# Patient Record
Sex: Female | Born: 1965 | Race: White | Hispanic: No | Marital: Married | Smoking: Never smoker
Health system: Southern US, Community
[De-identification: ages and names within clinical notes are randomized; demographics above are authoritative.]

## PROBLEM LIST (undated history)

## (undated) DIAGNOSIS — E559 Vitamin D deficiency, unspecified: Secondary | ICD-10-CM

## (undated) DIAGNOSIS — D1803 Hemangioma of intra-abdominal structures: Secondary | ICD-10-CM

## (undated) DIAGNOSIS — I493 Ventricular premature depolarization: Secondary | ICD-10-CM

## (undated) DIAGNOSIS — E785 Hyperlipidemia, unspecified: Secondary | ICD-10-CM

## (undated) DIAGNOSIS — K439 Ventral hernia without obstruction or gangrene: Secondary | ICD-10-CM

## (undated) DIAGNOSIS — I10 Essential (primary) hypertension: Secondary | ICD-10-CM

## (undated) DIAGNOSIS — I00 Rheumatic fever without heart involvement: Secondary | ICD-10-CM

## (undated) HISTORY — DX: Hemangioma of intra-abdominal structures: D18.03

## (undated) HISTORY — DX: Hyperlipidemia, unspecified: E78.5

## (undated) HISTORY — DX: Ventricular premature depolarization: I49.3

## (undated) HISTORY — DX: Essential (primary) hypertension: I10

## (undated) HISTORY — DX: Ventral hernia without obstruction or gangrene: K43.9

## (undated) HISTORY — DX: Vitamin D deficiency, unspecified: E55.9

## (undated) HISTORY — DX: Rheumatic fever without heart involvement: I00

---

## 2000-02-17 ENCOUNTER — Other Ambulatory Visit: Admission: RE | Admit: 2000-02-17 | Discharge: 2000-02-17 | Payer: Self-pay | Admitting: Family Medicine

## 2004-07-31 HISTORY — PX: PELVIC LAPAROSCOPY: SHX162

## 2007-02-26 ENCOUNTER — Ambulatory Visit: Payer: Self-pay | Admitting: Internal Medicine

## 2007-04-09 ENCOUNTER — Ambulatory Visit: Payer: Self-pay | Admitting: Internal Medicine

## 2007-04-09 LAB — CONVERTED CEMR LAB
BUN: 11 mg/dL (ref 6–23)
Basophils Absolute: 0 10*3/uL (ref 0.0–0.1)
CO2: 27 meq/L (ref 19–32)
Creatinine, Ser: 0.7 mg/dL (ref 0.4–1.2)
Eosinophils Absolute: 0 10*3/uL (ref 0.0–0.6)
Glucose, Bld: 95 mg/dL (ref 70–99)
Hemoglobin: 14.7 g/dL (ref 12.0–15.0)
Monocytes Relative: 3.9 % (ref 3.0–11.0)
Neutro Abs: 7.5 10*3/uL (ref 1.4–7.7)
Neutrophils Relative %: 84 % — ABNORMAL HIGH (ref 43.0–77.0)
Platelets: 261 10*3/uL (ref 150–400)
RBC: 5.03 M/uL (ref 3.87–5.11)
RDW: 13.1 % (ref 11.5–14.6)
Sodium: 140 meq/L (ref 135–145)
TSH: 2.1 microintl units/mL (ref 0.35–5.50)
WBC: 8.9 10*3/uL (ref 4.5–10.5)

## 2007-04-12 ENCOUNTER — Ambulatory Visit (HOSPITAL_COMMUNITY): Admission: RE | Admit: 2007-04-12 | Discharge: 2007-04-12 | Payer: Self-pay | Admitting: Internal Medicine

## 2007-06-25 ENCOUNTER — Encounter: Payer: Self-pay | Admitting: Internal Medicine

## 2008-01-08 ENCOUNTER — Ambulatory Visit: Payer: Self-pay | Admitting: Internal Medicine

## 2008-01-08 DIAGNOSIS — R0602 Shortness of breath: Secondary | ICD-10-CM | POA: Insufficient documentation

## 2008-10-22 ENCOUNTER — Encounter: Admission: RE | Admit: 2008-10-22 | Discharge: 2008-10-22 | Payer: Self-pay | Admitting: Neurology

## 2009-04-27 ENCOUNTER — Ambulatory Visit: Payer: Self-pay | Admitting: Cardiology

## 2009-04-27 DIAGNOSIS — I4949 Other premature depolarization: Secondary | ICD-10-CM

## 2009-04-27 DIAGNOSIS — R002 Palpitations: Secondary | ICD-10-CM

## 2009-04-29 ENCOUNTER — Telehealth (INDEPENDENT_AMBULATORY_CARE_PROVIDER_SITE_OTHER): Payer: Self-pay | Admitting: *Deleted

## 2009-05-05 ENCOUNTER — Telehealth: Payer: Self-pay | Admitting: Cardiology

## 2009-05-11 ENCOUNTER — Encounter: Payer: Self-pay | Admitting: Cardiology

## 2009-05-13 ENCOUNTER — Ambulatory Visit: Payer: Self-pay | Admitting: Cardiology

## 2009-05-19 ENCOUNTER — Telehealth (INDEPENDENT_AMBULATORY_CARE_PROVIDER_SITE_OTHER): Payer: Self-pay | Admitting: *Deleted

## 2009-07-08 ENCOUNTER — Ambulatory Visit: Payer: Self-pay | Admitting: Internal Medicine

## 2009-07-09 ENCOUNTER — Telehealth: Payer: Self-pay | Admitting: Internal Medicine

## 2009-07-09 ENCOUNTER — Ambulatory Visit (HOSPITAL_COMMUNITY): Admission: RE | Admit: 2009-07-09 | Discharge: 2009-07-09 | Payer: Self-pay | Admitting: Internal Medicine

## 2009-07-20 ENCOUNTER — Ambulatory Visit (HOSPITAL_COMMUNITY): Admission: RE | Admit: 2009-07-20 | Discharge: 2009-07-20 | Payer: Self-pay | Admitting: Internal Medicine

## 2009-07-20 ENCOUNTER — Ambulatory Visit: Payer: Self-pay

## 2009-07-20 ENCOUNTER — Ambulatory Visit: Payer: Self-pay | Admitting: Cardiology

## 2009-07-20 ENCOUNTER — Encounter: Payer: Self-pay | Admitting: Internal Medicine

## 2009-08-10 ENCOUNTER — Encounter: Admission: RE | Admit: 2009-08-10 | Discharge: 2009-08-10 | Payer: Self-pay | Admitting: Internal Medicine

## 2009-10-28 ENCOUNTER — Telehealth (INDEPENDENT_AMBULATORY_CARE_PROVIDER_SITE_OTHER): Payer: Self-pay | Admitting: *Deleted

## 2010-08-15 ENCOUNTER — Encounter
Admission: RE | Admit: 2010-08-15 | Discharge: 2010-08-15 | Payer: Self-pay | Source: Home / Self Care | Attending: Obstetrics & Gynecology | Admitting: Obstetrics & Gynecology

## 2010-08-22 ENCOUNTER — Encounter
Admission: RE | Admit: 2010-08-22 | Discharge: 2010-08-22 | Payer: Self-pay | Source: Home / Self Care | Attending: Obstetrics & Gynecology | Admitting: Obstetrics & Gynecology

## 2010-08-30 NOTE — Progress Notes (Signed)
  Faxed pt [facility], Recieved back 3/30,copied records Mailed out to pt today. [facility]  October 28, 2009 9:12 AM

## 2010-12-13 NOTE — Assessment & Plan Note (Signed)
[facility]                             PULMONARY OFFICE NOTE   Sandra Mcgrath, Sandra Mcgrath                        MRN:          [id number]  DATE:04/09/2007                            DOB:          04/23/1966    HISTORY:  A 45 year old white female seen with possible asthma on February 26, 2007, and scheduled for methacholine challenge test this week.  However, in the meantime, she has been having paroxysms of dyspnea over  the last several days that almost always occur with rest with the  sensation that she is not able to take a deep breath, and today had for  the 1st time numbness in both fingertips associated with one of these  spells.  She did take her husband's albuterol, but found it not to be of  any benefit.   She denies any associated chest pain, pleuritic pain, orthopnea, PND,  leg swelling, fevers, chills, sweats, over sinus or reflux symptoms. No  previous h/o nocturnal spells or reproduction of spells with exertion.   PHYSICAL EXAMINATION:  She is a pleasant ambulatory white female who  appears very healthy.  She takes obvious sigh breaths during the  interview and exam.  HEENT:  Unremarkable.  Oropharynx clear.  LUNG FIELDS:  Perfectly clear bilaterally to auscultation and  percussion.  HEART:  Regular rhythm without murmur, gallop, or rub.  ABDOMEN:  Soft and benign.  EXTREMITIES:  Warm without calf tenderness, cyanosis, clubbing, or  edema.   PFTs were performed today, and demonstrated to be normal while she was  active dyspnea.  We ambulated her around the hall.  We did 3 laps at 185  each rapidly.  We lost her pulse at one point related to possible  acrylic nails, but she maintained saturations above 95 with a peak heart  rate of around 182.   Chest x-ray was normal.   Lab studies are pending.   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  I spent extra time reviewing this issue with Dr. Lysbeth Penner, a  dentist,  using graphic formatted material to help her understand that she needs  to relax FRC before breathing, or her tidal breaths will feel very  uncomfortable.  To help her relax, I recommended a trial of Xanax 0.5 mg  1/2 every 6 hours, and did recommend strongly that she go ahead and have  the methacholine challenge test, because even though her PFTs are  normal, she may well have a component of asthma, and that a negative  methacholine challenge would be very helpful in the future in terms of  directing her care.    Sandra Mcgrath. Sandra Sires, MD, [facility]  Electronically Signed   MBW/MedQ  DD: 04/09/2007  DT: 04/10/2007  Job #: [id number]

## 2010-12-13 NOTE — Assessment & Plan Note (Signed)
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  Intermittently, she had the sensation that she  cannot get a deep breath that is resolved by taking shallower breaths.  It has not affected exercise tolerance nor sleep.   She has already been evaluated in [place] and found to have a normal  expiratory flow volume loop but somewhat truncated inspiratory loop (see  results below).  She has not tried any medications and denies any  obvious triggering alleviating factors that she can identify.  VQ scan  was reported to be normal, which presumably would have included a chest  x-ray, as well, on the same date.   She has had mild chronic nasal congestion with stuffy nose that is worse  since exposure to the fumes and dust as noted above, but only slightly  so and does not seem to correlate well with her other symptoms.   PAST MEDICAL HISTORY:  Cesarean section in 2000.   ALLERGIES:  None known.   MEDICATIONS:  Omega fish oil daily.   SOCIAL HISTORY:  She has never smoked, and she works as a Education officer, community.   FAMILY HISTORY:  Significant for asthma in her son, but notes that her  husband's side of the family is all asthmatic.   REVIEW OF SYSTEMS:  Taken in detail on the worksheet and negative,  except for as outlined above.   PHYSICAL EXAMINATION:  GENERAL:  This is a pleasant ambulatory white  female in no acute distress.  VITAL  SIGNS:  The patient had stable vital signs.  HEENT:  Unremarkable.  Oropharynx clear.  Nasal turbinates reveal  moderate non-specific turbinate edema with no excess discharge.  Oropharynx is clear with no evidence of postnasal drainage or excessive  throat cleaning.  LUNGS:  Lung fields are perfectly clear bilaterally to auscultation and  percussion.  HEART:  Regular rate and rhythm without murmur, gallop, rub or increase  in P2.  ABDOMEN:  Soft, benign.  EXTREMITIES:  Warm without calf tenderness.  No cyanosis, clubbing or  edema.   PFT's from [facility] from [facility] in [place]  dated February 08, 2007 indicate a perfectly normal expiratory loop but  somewhat truncated inspiratory loop without flattening.   IMPRESSION:  Upper airways instability associated with sensation of  difficult inspiration suggesting a form of vocal cord dysfunction  syndrome that may be related to occult reflux or may be functional in  nature.  Certainly, she has evidence of rhinitis with recent triggering  factors being exposure to a renovated house with heavy exposure to paint  and polyurethane fumes from flooring, as well as the dust caused by the  renovation.   I explained to the patient also that just being aware that her breathing  is a little bit different than normal, it may make her trigger extra  breaths similar to the hyperventilation syndrome because when she takes  voluntary breaths, there is no way to relax to FRC between breaths.   Reassuring is the fact that she can exercise normally and also sleep  fine.   RECOMMENDATIONS:  1. I recommended empiric therapy with Aciphex dosed 20 mg before      breakfast daily for the next 30 days, and, if not improving on      this, to have a methacholine challenge test done at the end of the      test to make sure she is not asthmatic.  2. Consider allergy evaluation if she continues to have significant      rhinitis symptoms by Dr.  Lucie Leather, who may also be treating her son      by that time.  3. I also gave her general guidance regarding minimizing exposure to      dust and fumes, including changing all the filters in her new      house.  4. Pulmonary follow up, however, can be p.r.n.     Charlaine Dalton. Sherene Sires, MD, [facility]  Electronically Signed    MBW/MedQ  DD: 02/26/2007  DT: 02/27/2007  Job #: [id number]

## 2010-12-19 ENCOUNTER — Encounter: Payer: BC Managed Care – PPO | Admitting: Genetic Counselor

## 2011-04-21 ENCOUNTER — Encounter: Payer: Self-pay | Admitting: Cardiology

## 2011-04-24 ENCOUNTER — Encounter: Payer: Self-pay | Admitting: Cardiology

## 2011-04-24 ENCOUNTER — Ambulatory Visit (INDEPENDENT_AMBULATORY_CARE_PROVIDER_SITE_OTHER): Payer: BC Managed Care – PPO | Admitting: Cardiology

## 2011-04-24 VITALS — BP 160/91 | HR 118 | Ht 66.0 in | Wt 129.0 lb

## 2011-04-24 DIAGNOSIS — R002 Palpitations: Secondary | ICD-10-CM

## 2011-04-24 DIAGNOSIS — I10 Essential (primary) hypertension: Secondary | ICD-10-CM

## 2011-04-24 MED ORDER — DILTIAZEM HCL ER COATED BEADS 120 MG PO CP24: 120.0000 mg | ORAL_CAPSULE | Freq: Every day | ORAL | Status: DC

## 2011-04-24 NOTE — Progress Notes (Signed)
PCP: Dr. Timothy Lasso  45 yo with history of palpitations and PACs/inappropriate sinus tachycardia as well as elevated blood pressure/probable HTN presents for cardiology evaluation.   Patient has a long history of palpitations.  Workup was initially done back in 2005 in [place].  She wore an event monitor and was found to have PACs as well as runs of inappropriate sinus tachycardia.  Subsequently, she had a negative coronary CT angiogram in 2007.  She saw Dr. Sherene Sires for a workup of dyspnea in 2010 that seems to have been unrevealing.  Echo was essentially normal in 5/11.    In August, patient had about 4 episodes where she would wake up at night feeling hot and feeling her heart race.  Heart rate would range from the 130s-150s during these episodes.  They would subside on their own.  For the last 10-14 days, she has had no more of these episodes of heart racing.  She had been on Toprol XL 12.5 mg daily.  Earlier this year, lisinopril 5 mg daily was added to her regimen for high blood pressure.  She became lightheaded on the combination and subsequently stopped both.  With the episodes in August, she restarted Toprol XL but thought is was dropping her HR too low so she stopped it again (HR around 50). She brings blood pressure readings from August until today.  Until about a week ago, SBP was in the 110s-120s.  Starting last week, BP has been running higher for no apparent reason.  It is now ranging in the 130s-160s/90s-100s when she checks at home (consistent with our reading in the office today).  She denies recent lightheadedness.   She has never had syncope.  She avoids all caffeine.    Patient has excellent exercise tolerance.  She frequently plays tennis.  No exertional dyspnea, no exertional chest pain.    ECG: NSR, very small R waves in V2 and V3, otherwise normal.   Labs (9/12): Lp(a) 30 (normal), K 4.1, creatinine 0.7, LDL 104, HDL 51  PMH: 1. Paroxysmal dyspnea: Workup by Dr. Sherene Sires in 2010.  Normal  PFTs, negative V/Q scan.  ? Related to Toprol XL use. ? Anxiety.  Echo (5/11) with EF 60-65%, trace MR, no significant abnormality.  2. Palpitations: PACs, inappropriate sinus tachycardia.  Workup in [place] in 2005.  Event monitor showed runs of inappropriate sinus tachycardia.  Holter in 2008 or 2009 showed PACs.   3. Coronary CT angiogram in 2007 was normal.   4. Migraines 5. Endometriosis 6. HTN  7. Anxiety  FH: HTN, no sudden cardiac death, no premature CAD.  Sister with breast cancer.   SH: Married, lives with husband in [place].  1 child.  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    ROS: All systems reviewed and negative except as per HPI.   Current Outpatient Prescriptions  Medication Sig Dispense Refill  . diazepam (VALIUM) 5 MG tablet Take 1/2 tablet for anxiety       . levonorgestrel (MIRENA) 20 MCG/24HR IUD 1 each by Intrauterine route once.        . diltiazem (CARDIZEM CD) 120 MG 24 hr capsule Take 1 capsule (120 mg total) by mouth daily.  30 capsule  11    BP 160/91  Pulse 118  Ht 5\' 6"  (1.676 m)  Wt 129 lb (58.514 kg)  BMI 20.82 kg/m2 General: NAD Neck: No JVD, no thyromegaly or thyroid nodule.  Lungs: Clear to auscultation bilaterally with normal respiratory effort.  CV: Nondisplaced PMI.  Heart regular S1/S2, no S3/S4, no murmur.  No peripheral edema.  No carotid bruit.  Normal pedal pulses.  Abdomen: Soft, nontender, no hepatosplenomegaly, no distention.  Skin: Intact without lesions or rashes.  Neurologic: Alert and oriented x 3.  Psych: Normal affect. Extremities: No clubbing or cyanosis.  HEENT: Normal.

## 2011-04-24 NOTE — Patient Instructions (Signed)
Your physician recommends that you schedule a follow-up appointment in: 3 WEEKS WITH DR [facility]  Your physician has recommended you make the following change in your medication: START DILTIAZEM CD 120 MG  Your physician recommends that you return for lab work in: 24 HOUR URINE  DX 785.1

## 2011-04-25 ENCOUNTER — Other Ambulatory Visit: Payer: BC Managed Care – PPO | Admitting: *Deleted

## 2011-04-25 DIAGNOSIS — I10 Essential (primary) hypertension: Secondary | ICD-10-CM | POA: Insufficient documentation

## 2011-04-25 DIAGNOSIS — R002 Palpitations: Secondary | ICD-10-CM

## 2011-04-25 NOTE — Assessment & Plan Note (Signed)
BP is running high today and was running high over the last week when she checked at home.  Prior to that, it had been under good control.  She does seem to have significant variation in her BP and arrhythmia symptoms over time.  Although the risk is probably not extremely high, I think that we should collect urinary catecholeamines to screen for pheochromocytoma, as this could give her episodic symptoms similar to what she is experiencing.  As above, I started diltiazem CD to see if this would lower BP and control palpitations.    I will have her followup in 2-3 week to see how diltiazem is working.

## 2011-04-25 NOTE — Assessment & Plan Note (Signed)
Patient has had documented PACs as well as inappropriate sinus tachycardia.  She was having runs of tachycardia that would awaken her at night back in 8/12 but has had none recently.  This certainly could have been IST.  For now, given extensive evaluation in the past, I am going to hold off on having her do another 3-week event monitor.  I will have her start diltiazem CD 120 mg daily.  This may help suppress PACs and IST and should also lower BP.  It may be better tolerated by her than Toprol XL.  If symptoms begin to recur on diltiazem, I will set her up for an event monitor.

## 2011-04-27 ENCOUNTER — Telehealth: Payer: Self-pay | Admitting: *Deleted

## 2011-04-27 NOTE — Telephone Encounter (Signed)
Message left on triage line from pt requesting results of 24 hour urine. Call back number is [phone].

## 2011-04-27 NOTE — Telephone Encounter (Signed)
Spoke with pt and let her know results are still pending. She states she had episode of tachycardia Monday and Wednesday night.  She is aware Dr. Shirlee Latch is not in office but I told her I would forward this message to him.

## 2011-04-28 LAB — METANEPHRINES, URINE, 24 HOUR
Metaneph Total, Ur: 280 mcg/24 h (ref 182–739)
Metanephrines, Ur: 95 mcg/24 h (ref 58–203)

## 2011-04-29 LAB — CATECHOLAMINES, FRACTIONATED, URINE, 24 HOUR
Calculated Total (E+NE): 39 mcg/24 h (ref 26–121)
Total Volume - CF 24Hr U: 1800 mL

## 2011-05-04 ENCOUNTER — Encounter: Payer: Self-pay | Admitting: Cardiology

## 2011-05-17 ENCOUNTER — Ambulatory Visit: Payer: BC Managed Care – PPO | Admitting: Cardiology

## 2011-06-05 ENCOUNTER — Ambulatory Visit: Payer: BC Managed Care – PPO | Admitting: Cardiology

## 2011-06-05 ENCOUNTER — Encounter: Payer: Self-pay | Admitting: Cardiology

## 2011-06-05 ENCOUNTER — Ambulatory Visit (INDEPENDENT_AMBULATORY_CARE_PROVIDER_SITE_OTHER): Payer: BC Managed Care – PPO | Admitting: Cardiology

## 2011-06-05 DIAGNOSIS — I10 Essential (primary) hypertension: Secondary | ICD-10-CM

## 2011-06-05 DIAGNOSIS — R002 Palpitations: Secondary | ICD-10-CM

## 2011-06-06 NOTE — Assessment & Plan Note (Signed)
BP under good control with Toprol XL 12.5 mg daily.

## 2011-06-06 NOTE — Progress Notes (Signed)
PCP: Dr. Timothy Lasso  45 yo with history of palpitations and PACs/inappropriate sinus tachycardia as well as elevated blood pressure/probable HTN returns for cardiology evaluation.   Patient has a long history of palpitations.  Workup was initially done back in 2005 in [place].  She wore an event monitor and was found to have PACs as well as runs of inappropriate sinus tachycardia.  Subsequently, she had a negative coronary CT angiogram in 2007.  She saw Dr. Sherene Sires for a workup of dyspnea in 2010 that seems to have been unrevealing.  Echo was essentially normal in 5/11.    In August, patient had about 4 episodes where she would wake up at night feeling hot and feeling her heart race.  Heart rate would range from the 130s-150s during these episodes.  They would subside on their own.  For the last 10-14 days, she has had no more of these episodes of heart racing.  She had been on Toprol XL 12.5 mg daily.  Earlier this year, lisinopril 5 mg daily was added to her regimen for high blood pressure.  She became lightheaded on the combination and subsequently stopped both.  With the episodes in August, she restarted Toprol XL but thought is was dropping her HR too low so she stopped it again (HR around 50).  Also, prior to initial appointment, BP had been running high.  She denies lightheadedness.   She has never had syncope.  She avoids all caffeine.  Patient has excellent exercise tolerance.  She frequently plays tennis.  No exertional dyspnea, no exertional chest pain.    At last appointment, I had her start diltiazem CD 120 mg daily.  This caused her ankles to swell so she stopped it.  She started back on Toprol XL 12.5 mg daily.  Her systolic BP has been running in the 110s to 120s on this and her palpitations have essentially resolved.  Urinary catecholeamines were normal.   Labs (9/12): Lp(a) 30 (normal), K 4.1, creatinine 0.7, LDL 104, HDL 51 Labs (10/12): Urinary catecholeamines normal  PMH: 1. Paroxysmal  dyspnea: Workup by Dr. Sherene Sires in 2010.  Normal PFTs, negative V/Q scan.  ? Related to Toprol XL use. ? Anxiety.  Echo (5/11) with EF 60-65%, trace MR, no significant abnormality.  2. Palpitations: PACs, inappropriate sinus tachycardia.  Workup in [place] in 2005.  Event monitor showed runs of inappropriate sinus tachycardia.  Holter in 2008 or 2009 showed PACs.  Urinary catecholeamines normal.  3. Coronary CT angiogram in 2007 was normal.   4. Migraines 5. Endometriosis 6. HTN: edema with diltiazem.  7. Anxiety  FH: HTN, no sudden cardiac death, no premature CAD.  Sister with breast cancer.   SH: Married, lives with husband in [place].  1 child.  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     Current Outpatient Prescriptions  Medication Sig Dispense Refill  . levonorgestrel (MIRENA) 20 MCG/24HR IUD 1 each by Intrauterine route once.        . metoprolol succinate (TOPROL-XL) 12.5 mg TB24 Take 12.5 mg by mouth daily.          BP 140/90  Pulse 84  Ht 5\' 6"  (1.676 m)  Wt 59.421 kg (131 lb)  BMI 21.14 kg/m2 General: NAD Neck: No JVD, no thyromegaly or thyroid nodule.  Lungs: Clear to auscultation bilaterally with normal respiratory effort. CV: Nondisplaced PMI.  Heart regular S1/S2, no S3/S4, no murmur.  No peripheral edema.  No carotid bruit.  Normal  pedal pulses.  Abdomen: Soft, nontender, no hepatosplenomegaly, no distention.  Skin: Intact without lesions or rashes.  Neurologic: Alert and oriented x 3.  Psych: Normal affect. Extremities: No clubbing or cyanosis.  HEENT: Normal.

## 2011-06-06 NOTE — Assessment & Plan Note (Signed)
Patient has had documented PACs as well as inappropriate sinus tachycardia.  She had been having runs of tachycardia that would awaken her at night.  This certainly could have been IST.  Given extensive evaluation in the past, I am going to hold off on having her do another 3-week event monitor. Blood pressure and symptoms both seem to be doing well on Toprol XL 12.5 mg daily so I will continue this.  She should continue to avoid caffeine.

## 2011-07-20 ENCOUNTER — Other Ambulatory Visit: Payer: Self-pay | Admitting: Obstetrics & Gynecology

## 2011-07-20 DIAGNOSIS — Z1231 Encounter for screening mammogram for malignant neoplasm of breast: Secondary | ICD-10-CM

## 2011-08-25 ENCOUNTER — Ambulatory Visit
Admission: RE | Admit: 2011-08-25 | Discharge: 2011-08-25 | Disposition: A | Discharge status: Home / Self Care | Payer: BC Managed Care – PPO | Source: Ambulatory Visit | Attending: Obstetrics & Gynecology | Admitting: Obstetrics & Gynecology

## 2011-08-25 DIAGNOSIS — Z1231 Encounter for screening mammogram for malignant neoplasm of breast: Secondary | ICD-10-CM

## 2011-10-12 ENCOUNTER — Encounter: Payer: Self-pay | Admitting: *Deleted

## 2011-10-12 ENCOUNTER — Telehealth: Payer: Self-pay | Admitting: Cardiology

## 2011-10-12 NOTE — Telephone Encounter (Signed)
Pt aware letter has been done and mailed to her at her request.

## 2011-10-12 NOTE — Telephone Encounter (Signed)
Pt needs letter stating that her EKG was normal so she can send it to her insurance co , it's up for renewal , pls mail to pt

## 2011-12-14 LAB — HM PAP SMEAR

## 2012-07-31 HISTORY — PX: AUGMENTATION MAMMAPLASTY: SUR837

## 2012-08-01 ENCOUNTER — Other Ambulatory Visit: Payer: Self-pay | Admitting: Internal Medicine

## 2012-08-01 DIAGNOSIS — Z1231 Encounter for screening mammogram for malignant neoplasm of breast: Secondary | ICD-10-CM

## 2012-08-27 ENCOUNTER — Ambulatory Visit
Admission: RE | Admit: 2012-08-27 | Discharge: 2012-08-27 | Disposition: A | Discharge status: Home / Self Care | Payer: BC Managed Care – PPO | Source: Ambulatory Visit | Attending: Internal Medicine | Admitting: Internal Medicine

## 2012-08-27 DIAGNOSIS — Z1231 Encounter for screening mammogram for malignant neoplasm of breast: Secondary | ICD-10-CM

## 2012-11-20 ENCOUNTER — Encounter: Payer: Self-pay | Admitting: Certified Nurse Midwife

## 2012-11-20 ENCOUNTER — Ambulatory Visit (INDEPENDENT_AMBULATORY_CARE_PROVIDER_SITE_OTHER): Payer: BC Managed Care – PPO | Admitting: Certified Nurse Midwife

## 2012-11-20 VITALS — BP 110/62 | HR 70 | Resp 16

## 2012-11-20 DIAGNOSIS — B372 Candidiasis of skin and nail: Secondary | ICD-10-CM

## 2012-11-20 DIAGNOSIS — B373 Candidiasis of vulva and vagina: Secondary | ICD-10-CM

## 2012-11-20 MED ORDER — NYSTATIN-TRIAMCINOLONE 100000-0.1 UNIT/GM-% EX OINT: TOPICAL_OINTMENT | Freq: Two times a day (BID) | CUTANEOUS | Status: DC

## 2012-11-20 MED ORDER — FLUCONAZOLE 150 MG PO TABS: 150.0000 mg | ORAL_TABLET | Freq: Once | ORAL | Status: DC

## 2012-11-20 NOTE — Progress Notes (Signed)
47 y.o.Married Caucasian female G3P1 with complaint of vaginal itching/irritation, with slight discharge, no odor in past 24 hours.  Sexually active: yes . Pt also reports the following associated symptoms: none Patient has tried over the counter treatment of Monistat, baking soda bath with minimal relief. No partner change or STD concerns. No new personal products     Exam:  ZOX:WRUEAV edema noted, redness                WUJ:[id number]: copious, white and thick, pH 4.0, wet prep done                Cx:  normal appearance, non tender                Uterus:normal size, non-tender                Adnexa: normal adnexa and no mass, fullness, tenderness  Wet Prep shows:Yeast   NF:AOZHY Vaginitis Yeast Dermatitis   P: Reviewed findings Rx Diflucan see order Rx Mycolog see order Aveeno sitz bath prn comfort   Reviewed, TL

## 2013-01-02 ENCOUNTER — Encounter: Payer: Self-pay | Admitting: Obstetrics & Gynecology

## 2013-01-02 ENCOUNTER — Ambulatory Visit (INDEPENDENT_AMBULATORY_CARE_PROVIDER_SITE_OTHER): Payer: BC Managed Care – PPO | Admitting: Obstetrics & Gynecology

## 2013-01-02 VITALS — BP 120/78 | HR 60 | Resp 16 | Ht 65.5 in | Wt 120.8 lb

## 2013-01-02 DIAGNOSIS — Z01419 Encounter for gynecological examination (general) (routine) without abnormal findings: Secondary | ICD-10-CM

## 2013-01-02 DIAGNOSIS — Z0000 Encounter for general adult medical examination without abnormal findings: Secondary | ICD-10-CM

## 2013-01-02 LAB — POCT URINALYSIS DIPSTICK
Bilirubin, UA: NEGATIVE
Ketones, UA: NEGATIVE
Leukocytes, UA: NEGATIVE
Protein, UA: NEGATIVE
Urobilinogen, UA: NEGATIVE

## 2013-01-02 MED ORDER — SULFAMETHOXAZOLE-TMP DS 800-160 MG PO TABS: 1.0000 | ORAL_TABLET | Freq: Two times a day (BID) | ORAL | Status: DC

## 2013-01-02 MED ORDER — FLUCONAZOLE 150 MG PO TABS: 150.0000 mg | ORAL_TABLET | Freq: Once | ORAL | Status: DC

## 2013-01-02 NOTE — Patient Instructions (Addendum)

## 2013-01-02 NOTE — Progress Notes (Signed)
47 y.o. G3P1 Sep.CaucasianF here for annual exam.  Dating.  Has not filed any paperwork for divorce yet.  Two yeast infections since then.  Spotted once since Mirena IUD placement 7/13.     Patient's last menstrual period was 12/23/2012.          Sexually active: yes  The current method of family planning is IUD.    Exercising: yes  tennis and walking Smoker:  no  Health Maintenance: Pap:  12/14/11 WNL/negative HR HPV History of abnormal Pap:  no MMG:  08/27/12 normal Colonoscopy:  none BMD:   none TDaP:  Up to date ? 2008 Screening Labs: PCP, Hb today: PCP, Urine today: neg   reports that she has never smoked. She has never used smokeless tobacco. She reports that  drinks alcohol. She reports that she does not use illicit drugs.  Past Medical History  Diagnosis Date  . Racing heart beat   . Hypertension   . Hyperlipidemia   . Juvenile rheumatic fever     no MVP, negative ECHO 12/10    Past Surgical History  Procedure Laterality Date  . Cesarean section  2/07    35 weeks  . Pelvic laparoscopy  2006    w/HSG, endometrioma  . Augmentation mammaplasty  07/2012    Current Outpatient Prescriptions  Medication Sig Dispense Refill  . cyanocobalamin (,VITAMIN B-12,) 1000 MCG/ML injection Inject 1,000 mcg into the muscle every 30 (thirty) days.      Marland Kitchen levonorgestrel (MIRENA) 20 MCG/24HR IUD 1 each by Intrauterine route once.        . metoprolol succinate (TOPROL-XL) 12.5 mg TB24 Take 12.5 mg by mouth daily.        No current facility-administered medications for this visit.    Family History  Problem Relation Age of Onset  . Hypertension Father 85    alive  . Hypertension Mother 25    alive  . Breast cancer Sister 47    genetic testing neg    ROS:  Pertinent items are noted in HPI.  Otherwise, a comprehensive ROS was negative.  Exam:   BP 120/78  Pulse 60  Resp 16  Ht 5' 5.5" (1.664 m)  Wt 120 lb 12.8 oz (54.795 kg)  BMI 19.79 kg/m2  LMP 12/23/2012  Weight change:  +1lb   Height: 5' 5.5" (166.4 cm)  Ht Readings from Last 3 Encounters:  01/02/13 5' 5.5" (1.664 m)  06/05/11 5\' 6"  (1.676 m)  04/24/11 5\' 6"  (1.676 m)    General appearance: alert, cooperative and appears stated age Head: Normocephalic, without obvious abnormality, atraumatic Neck: no adenopathy, supple, symmetrical, trachea midline and thyroid normal to inspection and palpation Lungs: clear to auscultation bilaterally Breasts: normal appearance, no masses or tenderness Heart: regular rate and rhythm Abdomen: soft, non-tender; bowel sounds normal; no masses,  no organomegaly Extremities: extremities normal, atraumatic, no cyanosis or edema Skin: Skin color, texture, turgor normal. No rashes or lesions Lymph nodes: Cervical, supraclavicular, and axillary nodes normal. No abnormal inguinal nodes palpated Neurologic: Grossly normal   Pelvic: External genitalia:  no lesions              Urethra:  normal appearing urethra with no masses, tenderness or lesions              Bartholins and Skenes: normal                 Vagina: normal appearing vagina with normal color and discharge, no lesions  Cervix: no lesions              Pap taken: yes Bimanual Exam:  Uterus:  normal size, contour, position, consistency, mobility, non-tender              Adnexa: normal adnexa and no mass, fullness, tenderness               Rectovaginal: Confirms               Anus:  normal sphincter tone, no lesions  A:  Well Woman with normal exam  P:   mammogram pap smear return annually or prn  An After Visit Summary was printed and given to the patient.

## 2013-01-02 NOTE — Progress Notes (Signed)
47 y.o. G3P1 Sep.CaucasianF here for annual exam.  Dating.  Two yeast infections since then.  Spotted once since Mirena IUD placement 7/13.     Patient's last menstrual period was 12/23/2012.          Sexually active: yes  The current method of family planning is IUD.    Exercising: yes  tennis and walking Smoker:  no  Health Maintenance: Pap:  12/14/11 WNL/negative HR HPV History of abnormal Pap:  no MMG:  08/27/12 normal Colonoscopy:  none BMD:   none TDaP:  Up to date ? 2008 Screening Labs: PCP, Hb today: PCP, Urine today: neg   reports that she has never smoked. She has never used smokeless tobacco. She reports that  drinks alcohol. She reports that she does not use illicit drugs.  Past Medical History  Diagnosis Date  . Racing heart beat   . Hypertension   . Hyperlipidemia   . Juvenile rheumatic fever     no MVP, negative ECHO 12/10    Past Surgical History  Procedure Laterality Date  . Cesarean section  2/07    35 weeks  . Pelvic laparoscopy  2006    w/HSG, endometrioma  . Augmentation mammaplasty  07/2012    Current Outpatient Prescriptions  Medication Sig Dispense Refill  . cyanocobalamin (,VITAMIN B-12,) 1000 MCG/ML injection Inject 1,000 mcg into the muscle every 30 (thirty) days.      Marland Kitchen levonorgestrel (MIRENA) 20 MCG/24HR IUD 1 each by Intrauterine route once.        . metoprolol succinate (TOPROL-XL) 12.5 mg TB24 Take 12.5 mg by mouth daily.        No current facility-administered medications for this visit.    Family History  Problem Relation Age of Onset  . Hypertension Father 62    alive  . Hypertension Mother 25    alive  . Breast cancer Sister 87    genetic testing neg    ROS:  Pertinent items are noted in HPI.  Otherwise, a comprehensive ROS was negative.  Exam:   BP 120/78  Pulse 60  Resp 16  Ht 5' 5.5" (1.664 m)  Wt 120 lb 12.8 oz (54.795 kg)  BMI 19.79 kg/m2  LMP 12/23/2012  Weight change: @WEIGHTCHANGE @ Height:   Height: 5' 5.5"  (166.4 cm)  Ht Readings from Last 3 Encounters:  01/02/13 5' 5.5" (1.664 m)  06/05/11 5\' 6"  (1.676 m)  04/24/11 5\' 6"  (1.676 m)    General appearance: alert, cooperative and appears stated age Head: Normocephalic, without obvious abnormality, atraumatic Neck: no adenopathy, supple, symmetrical, trachea midline and thyroid normal to inspection and palpation Lungs: clear to auscultation bilaterally Breasts: normal appearance, no masses or tenderness, with bilateral implants Heart: regular rate and rhythm Abdomen: soft, non-tender; bowel sounds normal; no masses,  no organomegaly Extremities: extremities normal, atraumatic, no cyanosis or edema Skin: Skin color, texture, turgor normal. No rashes or lesions Lymph nodes: Cervical, supraclavicular, and axillary nodes normal. No abnormal inguinal nodes palpated Neurologic: Grossly normal   Pelvic: External genitalia:  no lesions              Urethra:  normal appearing urethra with no masses, tenderness or lesions              Bartholins and Skenes: normal                 Vagina: normal appearing vagina with normal color and discharge, no lesions  Cervix: no lesions and IUD string noted              Pap taken: yes Bimanual Exam:  Uterus:  normal size, contour, position, consistency, mobility, non-tender              Adnexa: normal adnexa and no mass, fullness, tenderness               Rectovaginal: Confirms               Anus:  normal sphincter tone, no lesions  A:  Well Woman with normal exam Mirena for birth control, placed 7/13 Left endometrioma in pt with know hx Two recent yeats vaginitis occurrences  Family hx of breast cancer--sister H/O post-coital UTIs  P:   Mammogram yearly.  She is doing 3D pap smear today--reflex only if ASCUS Diflucan Rx to pharmacy Bactrim DS rx to pharmacy to use PRN Will repeat U/S one year return annually or prn  An After Visit Summary was printed and given to the patient.

## 2013-01-03 NOTE — Addendum Note (Signed)
Addended by: Clide Dales R on: 01/03/2013 04:58 PM   Modules accepted: Orders

## 2013-02-18 ENCOUNTER — Other Ambulatory Visit: Payer: Self-pay | Admitting: Obstetrics & Gynecology

## 2013-02-18 DIAGNOSIS — Z202 Contact with and (suspected) exposure to infections with a predominantly sexual mode of transmission: Secondary | ICD-10-CM

## 2013-02-19 ENCOUNTER — Other Ambulatory Visit: Payer: BC Managed Care – PPO

## 2013-02-19 ENCOUNTER — Telehealth: Payer: Self-pay | Admitting: Obstetrics & Gynecology

## 2013-02-19 NOTE — Telephone Encounter (Signed)
Thank you :)

## 2013-02-19 NOTE — Telephone Encounter (Signed)
Patient dnka lab appointment today @ 1:40, I left a message for patient to call and reschedule.

## 2013-03-05 ENCOUNTER — Other Ambulatory Visit (INDEPENDENT_AMBULATORY_CARE_PROVIDER_SITE_OTHER): Payer: BC Managed Care – PPO

## 2013-03-05 DIAGNOSIS — Z202 Contact with and (suspected) exposure to infections with a predominantly sexual mode of transmission: Secondary | ICD-10-CM

## 2013-06-05 ENCOUNTER — Other Ambulatory Visit: Payer: Self-pay

## 2013-06-23 ENCOUNTER — Telehealth: Payer: Self-pay | Admitting: Obstetrics & Gynecology

## 2013-06-23 ENCOUNTER — Ambulatory Visit (INDEPENDENT_AMBULATORY_CARE_PROVIDER_SITE_OTHER): Payer: BC Managed Care – PPO | Admitting: *Deleted

## 2013-06-23 DIAGNOSIS — N390 Urinary tract infection, site not specified: Secondary | ICD-10-CM

## 2013-06-23 DIAGNOSIS — N360 Urethral fistula: Secondary | ICD-10-CM

## 2013-06-23 MED ORDER — CIPROFLOXACIN HCL 500 MG PO TABS: 500.0000 mg | ORAL_TABLET | Freq: Two times a day (BID) | ORAL | Status: DC

## 2013-06-23 NOTE — Progress Notes (Signed)
Patient came in left urine for a urine culture was in a hurry had patients she needed to see wasn't able to get any vitals. Urine Culture sent to lab.

## 2013-06-23 NOTE — Telephone Encounter (Signed)
Pt called.  She had UTI symptoms over the weekend.  Dysuria and increased frequency.  Pt starting having blood in urine today.  She had some septra at home and started it two days ago.  No fevers just uncomfortable.  She wants to know if she can come in today or can have something called in.  She will come between 1-2 to leave a urine culture.  Knows to call if symptoms worsen or if she has fevers or back pain.

## 2013-07-21 ENCOUNTER — Other Ambulatory Visit: Payer: Self-pay

## 2013-07-21 DIAGNOSIS — Z1231 Encounter for screening mammogram for malignant neoplasm of breast: Secondary | ICD-10-CM

## 2013-08-29 ENCOUNTER — Ambulatory Visit
Admission: RE | Admit: 2013-08-29 | Discharge: 2013-08-29 | Disposition: A | Discharge status: Home / Self Care | Payer: Self-pay | Source: Ambulatory Visit

## 2013-08-29 ENCOUNTER — Other Ambulatory Visit: Payer: Self-pay

## 2013-08-29 DIAGNOSIS — Z1231 Encounter for screening mammogram for malignant neoplasm of breast: Secondary | ICD-10-CM

## 2013-10-08 ENCOUNTER — Telehealth: Payer: Self-pay | Admitting: Obstetrics & Gynecology

## 2013-10-08 MED ORDER — DIAZEPAM 10 MG PO TABS: 5.0000 mg | ORAL_TABLET | Freq: Four times a day (QID) | ORAL | Status: DC | PRN

## 2013-10-08 NOTE — Telephone Encounter (Signed)
Patient is requesting a refill of valum   Pharmacy [facility] [phone]

## 2013-10-08 NOTE — Telephone Encounter (Signed)
Rx done. Please fax

## 2013-10-08 NOTE — Telephone Encounter (Signed)
Last AEX 01/02/2013 Next appt 02/19/2014  Couldn't find last refill. Paper Chart in your door.

## 2013-10-08 NOTE — Telephone Encounter (Addendum)
-  Rx faxed. -LM for pt re: rx sent to pharmacy.

## 2013-11-25 ENCOUNTER — Other Ambulatory Visit: Payer: Self-pay | Admitting: Obstetrics & Gynecology

## 2013-11-25 ENCOUNTER — Ambulatory Visit (INDEPENDENT_AMBULATORY_CARE_PROVIDER_SITE_OTHER): Payer: BC Managed Care – PPO | Admitting: Obstetrics & Gynecology

## 2013-11-25 VITALS — BP 130/76 | HR 60 | Temp 98.2°F | Ht 65.5 in | Wt 126.0 lb

## 2013-11-25 DIAGNOSIS — N390 Urinary tract infection, site not specified: Secondary | ICD-10-CM

## 2013-11-25 DIAGNOSIS — R300 Dysuria: Secondary | ICD-10-CM

## 2013-11-25 LAB — POCT URINALYSIS DIPSTICK
Bilirubin, UA: NEGATIVE
GLUCOSE UA: NEGATIVE
KETONES UA: NEGATIVE
NITRITE UA: NEGATIVE
PH UA: 5
Urobilinogen, UA: NEGATIVE

## 2013-11-25 MED ORDER — NITROFURANTOIN MONOHYD MACRO 100 MG PO CAPS: 100.0000 mg | ORAL_CAPSULE | Freq: Two times a day (BID) | ORAL | Status: DC

## 2013-11-25 MED ORDER — CIPROFLOXACIN HCL 500 MG PO TABS: 500.0000 mg | ORAL_TABLET | Freq: Two times a day (BID) | ORAL | Status: DC

## 2013-11-25 NOTE — Progress Notes (Signed)
Patient ID: Sandra Mcgrath, female   DOB: [birth year], 48 y.o.   MRN: [id number] Pt presented for urine dip and culture for UTI.  Pt states she started Septra on Sunday, but has not had any change in symptoms.  Pt states she is having an achey feeling and pain with urination.  Cipro and Macrobid sent to [facility] by Dr. Sabra Heck.  Pt is notified of this by Maryann Conners, [place].   Urine dip shows 2+ leuks, trace protein and large RBC.  Urine sent for culture.

## 2013-11-26 LAB — URINE CULTURE
COLONY COUNT: NO GROWTH
ORGANISM ID, BACTERIA: NO GROWTH

## 2013-12-10 ENCOUNTER — Encounter: Payer: Self-pay | Admitting: Obstetrics & Gynecology

## 2014-02-19 ENCOUNTER — Ambulatory Visit: Payer: BC Managed Care – PPO | Admitting: Obstetrics & Gynecology

## 2014-03-06 ENCOUNTER — Encounter: Payer: Self-pay | Admitting: Obstetrics & Gynecology

## 2014-03-06 ENCOUNTER — Ambulatory Visit (INDEPENDENT_AMBULATORY_CARE_PROVIDER_SITE_OTHER): Payer: BC Managed Care – PPO | Admitting: Obstetrics & Gynecology

## 2014-03-06 VITALS — BP 122/78 | HR 64 | Resp 16 | Ht 65.75 in | Wt 122.8 lb

## 2014-03-06 DIAGNOSIS — Z01419 Encounter for gynecological examination (general) (routine) without abnormal findings: Secondary | ICD-10-CM

## 2014-03-06 DIAGNOSIS — Z0000 Encounter for general adult medical examination without abnormal findings: Secondary | ICD-10-CM

## 2014-03-06 LAB — POCT URINALYSIS DIPSTICK
Bilirubin, UA: NEGATIVE
Blood, UA: NEGATIVE
Glucose, UA: NEGATIVE
Ketones, UA: NEGATIVE
Leukocytes, UA: NEGATIVE
Nitrite, UA: NEGATIVE
Protein, UA: NEGATIVE
Urobilinogen, UA: NEGATIVE
pH, UA: 5

## 2014-03-06 MED ORDER — NITROFURANTOIN MONOHYD MACRO 100 MG PO CAPS: 100.0000 mg | ORAL_CAPSULE | Freq: Two times a day (BID) | ORAL | Status: DC

## 2014-03-06 MED ORDER — FLUCONAZOLE 150 MG PO TABS: 150.0000 mg | ORAL_TABLET | Freq: Once | ORAL | Status: DC

## 2014-03-06 NOTE — Patient Instructions (Signed)

## 2014-03-06 NOTE — Progress Notes (Signed)
48 y.o. G51P1 Separated Caucasian Female here for AEX.  Dating 48 year old gentleman who lives in [place].  He owns several country clubs.  No vaginal bleeding.  Last August, did labs with disability paperwork.  Reports normal.  Spouse getting remarried.  This is causing stress at work as well due to partnership with him.  Son in therapy.  Has had no recurrent UTIs since starting abx with intercourse.  Will need RFs.   No LMP recorded. Patient is not currently having periods (Reason: IUD).          Sexually active: Yes.    The current method of family planning is IUD.    Exercising: Yes.    walking and tennis Smoker:  no  Health Maintenance: Pap:  01/02/13 WNL History of abnormal Pap:  no MMG:  08/29/13 3D-normal Colonoscopy:  none BMD:   none TDaP:  2009 Screening Labs: Aug with disability , Hb today: n/a, Urine today: negative   reports that she has never smoked. She has never used smokeless tobacco. She reports that she drinks about 1.5 - 2 ounces of alcohol per week. She reports that she does not use illicit drugs.  Past Medical History  Diagnosis Date  . Racing heart beat   . Hypertension   . Hyperlipidemia   . Juvenile rheumatic fever     no MVP, negative ECHO 12/10    Past Surgical History  Procedure Laterality Date  . Cesarean section  2/07    35 weeks  . Pelvic laparoscopy  2006    w/HSG, endometrioma  . Augmentation mammaplasty  07/2012    Current Outpatient Prescriptions  Medication Sig Dispense Refill  . diazepam (VALIUM) 10 MG tablet Take 0.5 tablets (5 mg total) by mouth every 6 (six) hours as needed for anxiety. One as needed, not to exceed every 8 hours, for anxiety.  30 tablet  0  . levonorgestrel (MIRENA) 20 MCG/24HR IUD 1 each by Intrauterine route once.        . metoprolol succinate (TOPROL-XL) 12.5 mg TB24 Take 12.5 mg by mouth daily.       . nitrofurantoin, macrocrystal-monohydrate, (MACROBID) 100 MG capsule Take 1 capsule (100 mg total) by mouth 2 (two)  times daily.  30 capsule  6  . NON FORMULARY Vitamin B with Biotin daily      . cyanocobalamin (,VITAMIN B-12,) 1000 MCG/ML injection Inject 1,000 mcg into the muscle every 30 (thirty) days.      . fluconazole (DIFLUCAN) 150 MG tablet Take 1 tablet (150 mg total) by mouth once. Take for 5 days  5 tablet  0  . nystatin-triamcinolone ointment (MYCOLOG) Apply 1 application topically 2 (two) times daily.       No current facility-administered medications for this visit.    Family History  Problem Relation Age of Onset  . Hypertension Father 31    alive  . Hypertension Mother 62    alive  . Breast cancer Sister 30    genetic testing neg    ROS:  Pertinent items are noted in HPI.  Otherwise, a comprehensive ROS was negative.  Exam:   BP 122/78  Pulse 64  Resp 16  Ht 5' 5.75" (1.67 m)  Wt 122 lb 12.8 oz (55.702 kg)  BMI 19.97 kg/m2   Height: 5' 5.75" (167 cm)  Ht Readings from Last 3 Encounters:  03/06/14 5' 5.75" (1.67 m)  11/25/13 5' 5.5" (1.664 m)  01/02/13 5' 5.5" (1.664 m)  General appearance: alert, cooperative and appears stated age Head: Normocephalic, without obvious abnormality, atraumatic Neck: no adenopathy, supple, symmetrical, trachea midline and thyroid normal to inspection and palpation Lungs: clear to auscultation bilaterally Breasts: normal appearance, no masses or tenderness, with bilateral implants Heart: regular rate and rhythm Abdomen: soft, non-tender; bowel sounds normal; no masses,  no organomegaly Extremities: extremities normal, atraumatic, no cyanosis or edema Skin: Skin color, texture, turgor normal. No rashes or lesions Lymph nodes: Cervical, supraclavicular, and axillary nodes normal. No abnormal inguinal nodes palpated Neurologic: Grossly normal   Pelvic: External genitalia:  no lesions              Urethra:  normal appearing urethra with no masses, tenderness or lesions              Bartholins and Skenes: normal                 Vagina:  normal appearing vagina with normal color and discharge, no lesions              Cervix: no lesions, IUD string noted              Pap taken: No. Bimanual Exam:  Uterus:  normal size, contour, position, consistency, mobility, non-tender              Adnexa: normal adnexa and no mass, fullness, tenderness               Rectovaginal: Confirms               Anus:  normal sphincter tone, no lesions  A:  Well Woman with normal exam IUD for BC/cycle control/pain control due to hx of endometriosis H/O small endometrioma on ultrasound with no change at last PUS.  Will repeat if exam changes or pain starts Recurrent UTIs, in suppressive therapy Sister with breast cancer, diagnosed age 39.  Pt with negative genetic testing.  P:   Mammogram yearly.  Doing 3D.  Pt asks today about MRI.  I really feel this is more than she needs but if desires, I will order.  She would pay out of pocket costs.  Declines for now pap smear neg 6/14.  Neg HR HPV testing in past.  Will do co-test next year macrobid 100mg  as directed.  #30/13 RF Diflucan if needed.  Rx to pharmacy. return annually or prn  An After Visit Summary was printed and given to the patient.

## 2014-03-10 ENCOUNTER — Telehealth (INDEPENDENT_AMBULATORY_CARE_PROVIDER_SITE_OTHER): Payer: Self-pay

## 2014-03-10 NOTE — Telephone Encounter (Signed)
LMOM asking if we could push her appt time back an hour to 10:00 arrive to the office @ 9:45 b/c we are trying to put a surgery on at the hospital before Dr Donne Hazel comes over to the office to do this office sx. We would keep the same date just move the appt time back an hour.

## 2014-03-12 NOTE — Telephone Encounter (Signed)
Pt returned call and she is ok with the appt change.

## 2014-03-26 ENCOUNTER — Ambulatory Visit (INDEPENDENT_AMBULATORY_CARE_PROVIDER_SITE_OTHER): Payer: Self-pay | Admitting: General Surgery

## 2014-03-27 ENCOUNTER — Ambulatory Visit (INDEPENDENT_AMBULATORY_CARE_PROVIDER_SITE_OTHER): Payer: BC Managed Care – PPO | Admitting: General Surgery

## 2014-03-27 ENCOUNTER — Ambulatory Visit (INDEPENDENT_AMBULATORY_CARE_PROVIDER_SITE_OTHER): Payer: Self-pay | Admitting: General Surgery

## 2014-05-21 ENCOUNTER — Telehealth: Payer: Self-pay | Admitting: Obstetrics & Gynecology

## 2014-05-21 ENCOUNTER — Ambulatory Visit (INDEPENDENT_AMBULATORY_CARE_PROVIDER_SITE_OTHER): Payer: BC Managed Care – PPO | Admitting: Obstetrics & Gynecology

## 2014-05-21 VITALS — BP 128/82 | HR 64 | Resp 16 | Wt 121.0 lb

## 2014-05-21 DIAGNOSIS — A499 Bacterial infection, unspecified: Secondary | ICD-10-CM

## 2014-05-21 DIAGNOSIS — N760 Acute vaginitis: Secondary | ICD-10-CM

## 2014-05-21 DIAGNOSIS — N898 Other specified noninflammatory disorders of vagina: Secondary | ICD-10-CM

## 2014-05-21 DIAGNOSIS — B9689 Other specified bacterial agents as the cause of diseases classified elsewhere: Secondary | ICD-10-CM

## 2014-05-21 MED ORDER — TINIDAZOLE 500 MG PO TABS: 500.0000 mg | ORAL_TABLET | Freq: Once | ORAL | Status: DC

## 2014-05-21 NOTE — Progress Notes (Signed)
Subjective:     Patient ID: Sandra Mcgrath, female   DOB: March 14, 1966, 48 y.o.   MRN: [id number]  HPI 48 yo G3P1 Separate WF here for complaint of several days of vaginal discharge with odor.  Has been with current partner for about a year.  No dysuria.  No pelvic pain.  No fever.  Review of Systems  All other systems reviewed and are negative.      Objective:   Physical Exam  Constitutional: She is oriented to person, place, and time. She appears well-developed and well-nourished.  Abdominal: Soft. Bowel sounds are normal.  Genitourinary: There is no rash, tenderness or lesion on the right labia. There is no rash, tenderness or lesion on the left labia. No bleeding around the vagina. Vaginal discharge found.  Lymphadenopathy:       Right: No inguinal adenopathy present.       Left: No inguinal adenopathy present.  Neurological: She is alert and oriented to person, place, and time.  Skin: Skin is warm and dry.  Psychiatric: She has a normal mood and affect.   Wet smear:  Ph 5.0, KOH with + whiff, no yeast, saline with no trich, + clue cells, no WBCs    Assessment:     Bacterial vaginosis     Plan:     Tindamax 500mg  tablets.  2 grams x 2 days.  Rx to pharmacy GC and Chl testing.  Pt will be notified of results.  She knows to call if symptoms do not fully resolve.

## 2014-05-21 NOTE — Telephone Encounter (Signed)
Spoke with patient. Patient states that she began to have a "vaginal odor" 2 days ago. "It is not like a yeast infection. I am not itching. It is just a smell." States that she has been having a small amount of vaginal discharge as well. Patient declines to see a Designer, jewellery. Appointment scheduled for today at 12:45pm with Dr.Miller (time per Gay Filler). Patient is agreeable to date and time.  Routing to provider for final review. Patient agreeable to disposition. Will close encounter

## 2014-05-21 NOTE — Telephone Encounter (Signed)
Patient calling requesting to be seen today for "vaginal irritation." She declined appointments with nurse practitioners and requested to be worked in with Dr. Sabra Heck. She says she is off all day and can come any time.

## 2014-05-23 LAB — GC/CHLAMYDIA PROBE AMP, URINE
Chlamydia, Swab/Urine, PCR: NEGATIVE
GC Probe Amp, Urine: NEGATIVE

## 2014-05-24 ENCOUNTER — Encounter: Payer: Self-pay | Admitting: Obstetrics & Gynecology

## 2014-06-01 ENCOUNTER — Encounter: Payer: Self-pay | Admitting: Obstetrics & Gynecology

## 2014-08-04 ENCOUNTER — Other Ambulatory Visit: Payer: Self-pay

## 2014-08-04 DIAGNOSIS — Z1231 Encounter for screening mammogram for malignant neoplasm of breast: Secondary | ICD-10-CM

## 2014-08-31 ENCOUNTER — Other Ambulatory Visit: Payer: Self-pay | Admitting: Obstetrics & Gynecology

## 2014-08-31 MED ORDER — DIAZEPAM 10 MG PO TABS: 5.0000 mg | ORAL_TABLET | Freq: Four times a day (QID) | ORAL | Status: DC | PRN

## 2014-09-03 ENCOUNTER — Ambulatory Visit
Admission: RE | Admit: 2014-09-03 | Discharge: 2014-09-03 | Disposition: A | Discharge status: Home / Self Care | Payer: BLUE CROSS/BLUE SHIELD | Source: Ambulatory Visit

## 2014-09-03 DIAGNOSIS — Z1231 Encounter for screening mammogram for malignant neoplasm of breast: Secondary | ICD-10-CM

## 2014-11-09 ENCOUNTER — Other Ambulatory Visit (HOSPITAL_COMMUNITY): Payer: Self-pay | Admitting: Orthopaedic Surgery

## 2014-11-09 DIAGNOSIS — M79662 Pain in left lower leg: Secondary | ICD-10-CM

## 2014-11-10 ENCOUNTER — Ambulatory Visit (HOSPITAL_COMMUNITY): Admission: RE | Admit: 2014-11-10 | Payer: BLUE CROSS/BLUE SHIELD | Source: Ambulatory Visit

## 2014-11-10 ENCOUNTER — Other Ambulatory Visit (HOSPITAL_COMMUNITY): Payer: Self-pay | Admitting: Orthopaedic Surgery

## 2014-11-10 ENCOUNTER — Ambulatory Visit
Admission: RE | Admit: 2014-11-10 | Discharge: 2014-11-10 | Disposition: A | Discharge status: Home / Self Care | Payer: BLUE CROSS/BLUE SHIELD | Source: Ambulatory Visit | Attending: Orthopaedic Surgery | Admitting: Orthopaedic Surgery

## 2014-11-10 DIAGNOSIS — M79662 Pain in left lower leg: Secondary | ICD-10-CM

## 2014-12-15 ENCOUNTER — Telehealth: Payer: Self-pay | Admitting: Obstetrics & Gynecology

## 2014-12-15 DIAGNOSIS — N939 Abnormal uterine and vaginal bleeding, unspecified: Secondary | ICD-10-CM

## 2014-12-15 NOTE — Telephone Encounter (Signed)
Patient is spotting with the iud.

## 2014-12-15 NOTE — Telephone Encounter (Signed)
Spoke with patient. She has Mirena IUD that was placed 02/04/12 by Dr. Sabra Heck.  She states she had two episodes of spotting in 3 weeks. States this is spotting only, no pain, no vaginal discharge.  Patient cannot feel IUD strings but states she never has been able to. Patient is concerned and states "I never, ever, have bleeding" and requests office visit for evaluation.   Appointment with Dr. Sabra Heck for 12/24/14 at 1600 for evaluation. Patient to return call with heavy bleeding or concerns prior to appointment.  Patient agreeable. Routing to provider for final review. Patient agreeable to disposition. Will close encounter.

## 2014-12-15 NOTE — Telephone Encounter (Signed)
I think she would be better evaluated with a PUS.  Is that possible?

## 2014-12-15 NOTE — Addendum Note (Signed)
Addended by: Michele Mcalpine on: 12/15/2014 03:27 PM   Modules accepted: Orders

## 2014-12-15 NOTE — Telephone Encounter (Signed)
Order placed for pelvic ultrasound.  Patient contacted and agreeable to change appointment for ultrasound evaluation and consult with Dr. Sabra Heck.   Advised patient will be contacted with insurance coverage of ultrasound. Patient agreeable. Scheduled for 12/17/14 at 1530 for ultrasound and 1600 for follow up with Dr. Sabra Heck.     cc Felipa Emory

## 2014-12-16 NOTE — Telephone Encounter (Signed)
Pre-cert complete

## 2014-12-17 ENCOUNTER — Other Ambulatory Visit (INDEPENDENT_AMBULATORY_CARE_PROVIDER_SITE_OTHER): Payer: BLUE CROSS/BLUE SHIELD | Admitting: Obstetrics & Gynecology

## 2014-12-17 ENCOUNTER — Telehealth: Payer: Self-pay | Admitting: Obstetrics & Gynecology

## 2014-12-17 ENCOUNTER — Ambulatory Visit: Payer: BLUE CROSS/BLUE SHIELD

## 2014-12-17 ENCOUNTER — Ambulatory Visit: Payer: Self-pay | Admitting: Obstetrics & Gynecology

## 2014-12-17 NOTE — Telephone Encounter (Signed)
Pt left before ultrasound appointment began stating she could not wait. Pt was not upset, just needed to go. Pt agreeable to return call for scheduling. Pts ultrasound scheduled at 330. Pt arrived after 330 and with time to check in was 20 minutes past her appointment time. Pt left at 430.

## 2014-12-18 NOTE — Telephone Encounter (Signed)
Call to patient. VM confirms patient's first and last name. Left message appointment from yesterday rescheduled to 12-24-14 at 3pm. Requested call back to confirm if this will work for her.

## 2014-12-22 NOTE — Telephone Encounter (Signed)
Patient cancelled her PUS/Consult for 12/24/14. She said, "I feel the issue has resolved itself and I will call back in if I need any further assistance."

## 2014-12-22 NOTE — Telephone Encounter (Signed)
Encounter closed.  Thanks.  CC:  Sandra Mcgrath

## 2014-12-22 NOTE — Telephone Encounter (Signed)
Routing to Dr. Sabra Heck review.  Okay to close? Patient cancelled appointment.

## 2014-12-24 ENCOUNTER — Other Ambulatory Visit: Payer: BLUE CROSS/BLUE SHIELD | Admitting: Obstetrics & Gynecology

## 2014-12-24 ENCOUNTER — Other Ambulatory Visit: Payer: BLUE CROSS/BLUE SHIELD

## 2015-02-15 ENCOUNTER — Telehealth: Payer: Self-pay | Admitting: Obstetrics & Gynecology

## 2015-02-15 DIAGNOSIS — R102 Pelvic and perineal pain: Secondary | ICD-10-CM

## 2015-02-15 NOTE — Telephone Encounter (Signed)
Patient wants to schedule her annual with an ultrasound. She states she is having some discomfort and wants to have that ovary looked at. She is scheduled for 03/26/15 with Dr. Jeanie Cooks

## 2015-02-15 NOTE — Telephone Encounter (Signed)
Left message to call Kaitlyn at [phone]. 

## 2015-02-17 NOTE — Telephone Encounter (Signed)
Spoke with patient. Patient states that she has been follow for a "endometrioma" of her left ovary in the past. Has now noticed an intermittent tenderness on her right lover pelvic area. "I think it may just be a cyst or something." Denies any current discomfort or any sharp pain. Denies nausea, bloating, or vomiting. Patient is requesting to have a PUS appointment on 03/25/2015 with a follow up aex/consultation with Dr.Miller after. "I don't feel like this is an emergency and I can wait to have the ultrasound then." Advised we do not schedule these in the same day due to timing needed for each appointment. Patient is requesting I speak with Dr.Miller about scheduling. Advised will speak with Dr.Miller and return call with further recommendations. Patient is agreeable.

## 2015-02-18 NOTE — Telephone Encounter (Signed)
If can fit in schedule before AEX, ok to do PUS and then AEX.  Thanks.

## 2015-02-18 NOTE — Telephone Encounter (Signed)
Return call to patient, left message to call back.  Patient requesting ultrasound on 03-25-15. Annual exam is currently scheduled for 03-26-15.

## 2015-02-24 NOTE — Telephone Encounter (Signed)
Patient is returning a call to [facility] regarding scheduling an ultrasound 03/25/15.

## 2015-02-25 NOTE — Telephone Encounter (Signed)
Left message to call Sandra Mcgrath at [phone]. 

## 2015-02-26 NOTE — Telephone Encounter (Signed)
Call to patient. Advised this is an exception. Pelvic ultrasound and annual exam schedule for 03-25-15 at 3pm. Annual for 03-26-15 canceled.   Routing to provider for final review. Patient agreeable to disposition. Will close encounter.

## 2015-03-15 ENCOUNTER — Other Ambulatory Visit: Payer: Self-pay | Admitting: Obstetrics & Gynecology

## 2015-03-15 MED ORDER — DIAZEPAM 10 MG PO TABS: ORAL_TABLET | ORAL | Status: DC

## 2015-03-23 ENCOUNTER — Telehealth: Payer: Self-pay | Admitting: Obstetrics & Gynecology

## 2015-03-23 NOTE — Telephone Encounter (Signed)
Spoke with patient. Reviewed benefits for PUS. Patient understood and agreeable. Verified arrival date/time for appointment. Patient agreeable. Ok to close.

## 2015-03-25 ENCOUNTER — Encounter: Payer: Self-pay | Admitting: Obstetrics & Gynecology

## 2015-03-25 ENCOUNTER — Ambulatory Visit (INDEPENDENT_AMBULATORY_CARE_PROVIDER_SITE_OTHER): Payer: BLUE CROSS/BLUE SHIELD | Admitting: Obstetrics & Gynecology

## 2015-03-25 ENCOUNTER — Ambulatory Visit (INDEPENDENT_AMBULATORY_CARE_PROVIDER_SITE_OTHER): Payer: BLUE CROSS/BLUE SHIELD

## 2015-03-25 VITALS — BP 120/82 | HR 64 | Resp 16 | Wt 127.0 lb

## 2015-03-25 DIAGNOSIS — Z01419 Encounter for gynecological examination (general) (routine) without abnormal findings: Secondary | ICD-10-CM | POA: Diagnosis not present

## 2015-03-25 DIAGNOSIS — Z975 Presence of (intrauterine) contraceptive device: Secondary | ICD-10-CM | POA: Diagnosis not present

## 2015-03-25 DIAGNOSIS — R102 Pelvic and perineal pain: Secondary | ICD-10-CM | POA: Diagnosis not present

## 2015-03-25 DIAGNOSIS — R1031 Right lower quadrant pain: Secondary | ICD-10-CM | POA: Diagnosis not present

## 2015-03-25 DIAGNOSIS — Z124 Encounter for screening for malignant neoplasm of cervix: Secondary | ICD-10-CM | POA: Diagnosis not present

## 2015-03-25 MED ORDER — NITROFURANTOIN MONOHYD MACRO 100 MG PO CAPS: ORAL_CAPSULE | ORAL | Status: DC

## 2015-03-25 NOTE — Progress Notes (Signed)
49 y.o. G3P1 CaucasianF here for annual exam.  She is doing well.  Son is a Equities trader in high school at [employer].      Pt reports having one small amount of spotting last week and a little pelvic pain.  This has resolved.  PUS scheduled today. Due to this.  H/o endometrioma on the left but pt states pain was on the right.  Again, has resolved.  Does have IUD.  Cannot feel strings.  Ultrasound findings reviewed with pt today:  Uteurs 10 x 5 x 4cm with IUD present.  IUD appears to be tilted posteriorly but in correct location within endometrial cavity.  Strings noted in cervix.  Left ovary 3.1 x 2.4 x 1.6cm with 1.6 x 1.3cm probable endometrioma (was 16 x 38mm).  Right ovary 2.5 x 1.8 x 9.9BZ with follicles.  No free fluid in PCDS.  Declines STD testing.  Was done in the fall last year and was negative.  No partner change since then.  PCP:  Dr. Virgina Jock.  Last blood work was in January.  Also did an echo cardiogram after that visit due to h/o rheumatic fever.  No LMP recorded. Patient is not currently having periods (Reason: IUD).          Sexually active: Yes.    The current method of family planning is IUD.    Exercising: Yes.    walking 2 miles daily and tennis Smoker:  no  Health Maintenance: Pap:  01/02/13 WNL History of abnormal Pap:  no MMG:  09/03/14 3D BiRads 1-negative Colonoscopy:  none BMD:   none TDaP:  2009 Screening Labs: PCP, Hb today: PCP, Urine today: PCP   reports that she has never smoked. She has never used smokeless tobacco. She reports that she drinks about 1.5 - 2.0 oz of alcohol per week. She reports that she does not use illicit drugs.  Past Medical History  Diagnosis Date  . Racing heart beat   . Hypertension   . Hyperlipidemia   . Juvenile rheumatic fever     no MVP, negative ECHO 12/10    Past Surgical History  Procedure Laterality Date  . Cesarean section  2/07    35 weeks  . Pelvic laparoscopy  2006    w/HSG, endometrioma  . Augmentation mammaplasty   07/2012    Current Outpatient Prescriptions  Medication Sig Dispense Refill  . Azithromycin (ZITHROMAX Z-PAK PO) Take by mouth. Started today    . cyanocobalamin (,VITAMIN B-12,) 1000 MCG/ML injection Inject 1,000 mcg into the muscle every 30 (thirty) days.    . 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 30 tablet 0  . levonorgestrel (MIRENA) 20 MCG/24HR IUD 1 each by Intrauterine route once.      . metoprolol succinate (TOPROL-XL) 12.5 mg TB24 Take 12.5 mg by mouth daily.     . nitrofurantoin, macrocrystal-monohydrate, (MACROBID) 100 MG capsule Take 1 capsule (100 mg total) by mouth 2 (two) times daily. 30 capsule 13  . NON FORMULARY Vitamin B with Biotin daily     No current facility-administered medications for this visit.    Family History  Problem Relation Age of Onset  . Hypertension Father 46    alive  . Hypertension Mother 11    alive  . Breast cancer Sister 5    genetic testing neg    ROS:  Pertinent items are noted in HPI.  Otherwise, a comprehensive  ROS was negative.  Exam:   General appearance: alert, cooperative and appears stated age Head: Normocephalic, without obvious abnormality, atraumatic Neck: no adenopathy, supple, symmetrical, trachea midline and thyroid normal to inspection and palpation Lungs: clear to auscultation bilaterally Breasts: normal appearance, no masses or tenderness, breast implants bilaterally Heart: regular rate and rhythm Abdomen: soft, non-tender; bowel sounds normal; no masses,  no organomegaly Extremities: extremities normal, atraumatic, no cyanosis or edema Skin: Skin color, texture, turgor normal. No rashes or lesions Lymph nodes: Cervical, supraclavicular, and axillary nodes normal. No abnormal inguinal nodes palpated Neurologic: Grossly normal   Pelvic: External genitalia:  no lesions              Urethra:  normal appearing urethra with no masses, tenderness or  lesions              Bartholins and Skenes: normal                 Vagina: normal appearing vagina with normal color and discharge, no lesions              Cervix: no lesions              Pap taken: Yes.   Bimanual Exam:  Uterus:  normal size, contour, position, consistency, mobility, non-tender              Adnexa: normal adnexa and no mass, fullness, tenderness               Rectovaginal: Confirms               Anus:  normal sphincter tone, no lesions  Chaperone was present for exam.  A:  Well Woman with normal exam IUD for BC/cycle control/pain control due to hx of endometriosis H/O small endometrioma on ultrasound.  Slightly smaller today. Episode of spotting last week with RLQ pain that has resolved.  PUS reassuring today. Recurrent UTIs, on suppressive therapy Sister with breast cancer, diagnosed age 64. Pt with negative genetic testing. Mirena IUD for contraception  P: Mammogram yearly. Doing 3D.  Pap smear neg 6/14. Neg HR HPV testing 2013.  Pap with HR HPV today.  Macrobid 100mg  as directed. #30/13 RF Labs with Dr. Virgina Jock earlier this year.   Has current RX for valium which she uses only with flying return annually or prn

## 2015-03-26 ENCOUNTER — Ambulatory Visit: Payer: BC Managed Care – PPO | Admitting: Obstetrics & Gynecology

## 2015-03-26 DIAGNOSIS — Z975 Presence of (intrauterine) contraceptive device: Secondary | ICD-10-CM | POA: Insufficient documentation

## 2015-03-31 LAB — IPS PAP TEST WITH HPV

## 2015-04-01 ENCOUNTER — Telehealth: Payer: Self-pay | Admitting: Obstetrics & Gynecology

## 2015-04-01 DIAGNOSIS — R87612 Low grade squamous intraepithelial lesion on cytologic smear of cervix (LGSIL): Secondary | ICD-10-CM

## 2015-04-01 NOTE — Telephone Encounter (Signed)
Called patient to review benefits for procedure. Left voicemail to call back and review. °

## 2015-04-01 NOTE — Telephone Encounter (Signed)
Patient called and left a message at lunch stating, "I am calling and supposed to ask for Sandra Mcgrath to schedule a follow up appointment with Dr. Sabra Heck.

## 2015-04-01 NOTE — Telephone Encounter (Signed)
-----   Message from Megan Salon, MD sent at 04/01/2015 12:39 PM EDT ----- I left personal and detailed message on cell phone (after reviewing DPR) advising of LGSIL pap, +HR HPV testing, and need for colposcopy.  Asked pt to return phone call to get procedure scheduled.  Cc: Maryann Conners

## 2015-04-01 NOTE — Telephone Encounter (Signed)
Spoke with Dr. Luretha Rued and scheduled colposcopy for 04/06/15 at 1530 (patient aware Dr. Sabra Heck coming from surgery that day).  Motrin 800 mg po one hour before with food. Patient with Mirena IUD. Not on cycle.  Advised she will be contacted with insurance coverage for procedure. She is agreeable.       cc Kerry Hough.

## 2015-04-06 ENCOUNTER — Ambulatory Visit (INDEPENDENT_AMBULATORY_CARE_PROVIDER_SITE_OTHER): Payer: BLUE CROSS/BLUE SHIELD | Admitting: Obstetrics & Gynecology

## 2015-04-06 DIAGNOSIS — R87612 Low grade squamous intraepithelial lesion on cytologic smear of cervix (LGSIL): Secondary | ICD-10-CM

## 2015-04-06 NOTE — Progress Notes (Signed)
Subjective:     Patient ID: Sandra Mcgrath, female   DOB: October 28, 1965, 49 y.o.   MRN: [id number]  HPI 49 yo G1P1 DWF here for colposcopy due to LGSIL pap smear with +HR HPV obtained 03/25/15.  This is pt's first abnormal pap smear.  Pt was notified of results and questions answered last week.  She has a few more questions today.  Procedure, risks and benefits reviewed.  All questions answered.  Consent obtained.    Review of Systems     Objective:   Physical Exam  Constitutional: She appears well-developed and well-nourished.  Genitourinary:    Vitals reviewed.  BP 116/80 mmHg  Pulse 64  Resp 16  General appearance: alert, cooperative and appears stated age Head: Normocephalic, without obvious abnormality, atraumatic Neurologic: Grossly normal  Pelvic: External genitalia:  no lesions              Urethra:  normal appearing urethra with no masses, tenderness or lesions              Bartholins and Skenes: normal                 Vagina: normal appearing vagina with normal color and discharge, no lesions              Cervix: no lesions              Pap taken: No.  Speculum placed.  3% acetic acid applied to cervix for >45 seconds.  Cervix visualized with both 7.5X and 15X magnification.  Green filter also used.  Lugols solution was used.  Findings:  Small area of HPV changes at 6 o'clock and small area of decreased staining with Lugol's at 1 o'clock.  Biopsy:  1 and 6 o'clock.  ECC:  was performed.  Monsel's was needed.  Excellent hemostasis was present.  Pt tolerated procedure well and all instruments were removed.  Findings noted above on picture of cervix.     Assessment:     LGSIL with +HR HPV     Plan:     Biopsies and ECC pending.  Suspect this will show HPV changes only.  If CIN 1 or less, repeat pap and HR HPV 1 year.

## 2015-04-09 LAB — IPS OTHER TISSUE BIOPSY

## 2015-08-06 ENCOUNTER — Telehealth: Payer: Self-pay | Admitting: Obstetrics & Gynecology

## 2015-08-06 MED ORDER — DIAZEPAM 10 MG PO TABS: ORAL_TABLET | ORAL | Status: DC

## 2015-08-06 MED ORDER — NITROFURANTOIN MONOHYD MACRO 100 MG PO CAPS: ORAL_CAPSULE | ORAL | Status: DC

## 2015-08-06 NOTE — Telephone Encounter (Signed)
Pt sent me an email message requesting RF for nitrofurantoin that she uses for prevention of UTIs.  Also, pt uses valium for flying and was given 30 last year and needs RF.  #30/0RF given.  Pt notified these have been completed.

## 2015-08-09 ENCOUNTER — Other Ambulatory Visit: Payer: Self-pay

## 2015-08-09 DIAGNOSIS — Z1231 Encounter for screening mammogram for malignant neoplasm of breast: Secondary | ICD-10-CM

## 2015-09-08 ENCOUNTER — Ambulatory Visit
Admission: RE | Admit: 2015-09-08 | Discharge: 2015-09-08 | Disposition: A | Discharge status: Home / Self Care | Payer: BLUE CROSS/BLUE SHIELD | Source: Ambulatory Visit

## 2015-09-08 DIAGNOSIS — Z1231 Encounter for screening mammogram for malignant neoplasm of breast: Secondary | ICD-10-CM

## 2015-10-01 ENCOUNTER — Ambulatory Visit: Payer: BLUE CROSS/BLUE SHIELD | Admitting: Obstetrics & Gynecology

## 2015-10-04 ENCOUNTER — Telehealth: Payer: Self-pay | Admitting: Emergency Medicine

## 2015-10-04 DIAGNOSIS — Z1239 Encounter for other screening for malignant neoplasm of breast: Secondary | ICD-10-CM

## 2015-10-04 DIAGNOSIS — Z9882 Breast implant status: Secondary | ICD-10-CM

## 2015-10-04 DIAGNOSIS — R922 Inconclusive mammogram: Secondary | ICD-10-CM

## 2015-10-04 DIAGNOSIS — Z803 Family history of malignant neoplasm of breast: Secondary | ICD-10-CM

## 2015-10-04 NOTE — Telephone Encounter (Signed)
-----   Message from Megan Salon, MD sent at 10/03/2015  9:32 PM EST ----- Please call pt and let her know that on her MMG result this year, the breast center did a risk assessment and due to her sister's breast cancer and age 48, she now falls into a higher risk category where yearly MRIs are also recommended.  I would recommend she consider proceeding with MRI as well as MMG.   She has a follow up Pap scheduled for 10/15/15 and I will be happy to discuss this further with her at that time.  Thanks.

## 2015-10-04 NOTE — Telephone Encounter (Signed)
Order pended for Breast MRI.  Message left to [facility] to discuss risk assessment for planning prior authorization of MRI.

## 2015-10-14 ENCOUNTER — Encounter: Payer: Self-pay | Admitting: Obstetrics & Gynecology

## 2015-10-14 ENCOUNTER — Ambulatory Visit (INDEPENDENT_AMBULATORY_CARE_PROVIDER_SITE_OTHER): Payer: BLUE CROSS/BLUE SHIELD | Admitting: Obstetrics & Gynecology

## 2015-10-14 VITALS — BP 120/86 | HR 78 | Resp 16 | Ht 66.0 in | Wt 132.0 lb

## 2015-10-14 DIAGNOSIS — N870 Mild cervical dysplasia: Secondary | ICD-10-CM

## 2015-10-14 DIAGNOSIS — Z803 Family history of malignant neoplasm of breast: Secondary | ICD-10-CM

## 2015-10-14 NOTE — Progress Notes (Signed)
Subjective:     Patient ID: Sandra Mcgrath, female   DOB: 05-21-1966, 50 y.o.   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  Pt does have +HR HPV as well.  After colposcopy, recommended follow-up was 1 year with pap and HR HPV per guidelines.  Pt was anxious with this so is here for interval testing.  Doing well.  Has set a date for wedding.  Having some work stressors.  Deciding what is personally best for her.  [place]   No vaginal bleeding.  Has Mirena IUD.  Denies vaginal discharge as well.  Denies pelvic pain.  Pt's MMG also reviewed with pt.  Statement about MRI recommendation reviewed.  Risk assessment had not been done.  We did it after getting the MMG and her risk is below the threshold for having MRI's done.  Pt is having 3D MMGs.  Her sister, also, had genetic testing that was negative.  Pt is very comfortable with doing 3D MMG and not having MRIs.  Grateful that we investigated this further and reviewed with her today.  Review of Systems  All other systems reviewed and are negative.      Objective:   Physical Exam  Constitutional: She appears well-developed and well-nourished.  Abdominal: Soft. Bowel sounds are normal.  Genitourinary: Vagina normal. There is no rash, tenderness or lesion on the right labia. There is no rash, tenderness or lesion on the left labia. Cervix exhibits no motion tenderness, no discharge and no friability.  IUD string noted.  Lymphadenopathy:       Right: No inguinal adenopathy present.       Left: No inguinal adenopathy present.  Skin: Skin is warm and dry.  Psychiatric: She has a normal mood and affect.       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  Pt in agreement with plan.  Continue yearly 3D MMGs

## 2015-10-15 ENCOUNTER — Ambulatory Visit: Payer: BLUE CROSS/BLUE SHIELD | Admitting: Obstetrics & Gynecology

## 2015-10-21 LAB — IPS PAP SMEAR ONLY

## 2015-10-26 ENCOUNTER — Ambulatory Visit (INDEPENDENT_AMBULATORY_CARE_PROVIDER_SITE_OTHER): Payer: BLUE CROSS/BLUE SHIELD | Admitting: Obstetrics & Gynecology

## 2015-10-26 ENCOUNTER — Encounter: Payer: Self-pay | Admitting: Obstetrics & Gynecology

## 2015-10-26 ENCOUNTER — Telehealth: Payer: Self-pay | Admitting: Obstetrics & Gynecology

## 2015-10-26 VITALS — BP 128/80 | HR 72 | Resp 16 | Ht 66.0 in | Wt 131.0 lb

## 2015-10-26 DIAGNOSIS — N766 Ulceration of vulva: Secondary | ICD-10-CM | POA: Diagnosis not present

## 2015-10-26 MED ORDER — VALACYCLOVIR HCL 1 G PO TABS: 1000.0000 mg | ORAL_TABLET | Freq: Two times a day (BID) | ORAL | Status: DC

## 2015-10-26 NOTE — Telephone Encounter (Signed)
Dr. Luretha Rued wants to come in to see Dr. Sabra Heck today. She has a very painful lesion and can come after 4pm today if possible.

## 2015-10-26 NOTE — Telephone Encounter (Signed)
Spoke with patient. She c/o at least one week of redness, raised and ulcerated area in area on buttock after waxing to area. She states she has been placing neosporin, warm compresses and watching the area. Feels it is worsening with pain radiating down her legs at this time. She is scheduled for office visit today at 1545 with Dr. Sabra Heck (time okay per Lamont Snowball, RN).   Routing to provider for final review. Patient agreeable to disposition. Will close encounter.

## 2015-10-26 NOTE — Progress Notes (Signed)
Subjective:     Patient ID: Sandra Mcgrath, female   DOB: 03-16-1966, 50 y.o.   MRN: [id number]  HPI 50 yo G1P1 DWF here with complaint of continued vulvar irritation since she had a recent waxing.  Pt is feeling malaise as well as some pain into her left left over the last day or two.  She is having discomfort as well.  No fevers.  No vaginal bleeding or discharge.  Just wanted to come back and be rechecked.  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  Review of Systems  Constitutional: Positive for fatigue.  All other systems reviewed and are negative.     Objective:   Physical Exam  Constitutional: She appears well-developed and well-nourished.  Genitourinary: Vagina normal.    There is no rash, tenderness, lesion or injury on the right labia. There is no rash, tenderness, lesion or injury on the left labia.  Lymphadenopathy:       Right: No inguinal adenopathy present.       Left: No inguinal adenopathy present.  Neurological: She is alert.  Skin: Skin is warm and dry.  Psychiatric: She has a normal mood and affect.       Assessment:     Vulvar ulceration     Plan:     HSV viral culture and HSV IGG and IGM 1/2 testing will be obtained today.  Results will be called to pt and additional recommendations will be made.  Valtrex 1 gm bid x 10 days to pharmacy

## 2015-10-26 NOTE — Telephone Encounter (Signed)
Message left to return call to Isak Sotomayor at [phone].    

## 2015-10-28 LAB — HERPES SIMPLEX VIRUS CULTURE: ORGANISM ID, BACTERIA: DETECTED

## 2015-10-28 LAB — HSV(HERPES SMPLX)ABS-I+II(IGG+IGM)-BLD
HSV 1 Glycoprotein G Ab, IgG: 48.7 Index — ABNORMAL HIGH (ref <0.90)
HSV 2 Glycoprotein G Ab, IgG: <0.9 Index (ref <0.90)
Herpes Simplex Vrs I&II-IgM Ab (EIA): 0.76 INDEX

## 2015-11-01 ENCOUNTER — Other Ambulatory Visit: Payer: Self-pay | Admitting: Obstetrics & Gynecology

## 2015-11-01 ENCOUNTER — Ambulatory Visit (INDEPENDENT_AMBULATORY_CARE_PROVIDER_SITE_OTHER): Payer: BLUE CROSS/BLUE SHIELD | Admitting: Obstetrics & Gynecology

## 2015-11-01 DIAGNOSIS — N766 Ulceration of vulva: Secondary | ICD-10-CM | POA: Diagnosis not present

## 2015-11-01 DIAGNOSIS — R899 Unspecified abnormal finding in specimens from other organs, systems and tissues: Secondary | ICD-10-CM

## 2015-11-01 NOTE — Progress Notes (Signed)
Subjective:     Patient ID: Sandra Mcgrath, female   DOB: 1966-01-07, 50 y.o.   MRN: [id number]  HPI 50 yo G1P1 DWF here for follow-up testing of vulvar ulceration.  Pt communicated with me over the weekend in regards to test results.  Possible scenarios for results were discussed.  Pt feel need to proceed with additional testing.  Here for this today.  States she started to feel better last night.  Malaise and fatigue are better.  Pain is better.  No fevers.  No drainage or vaginal/vulvar bleeding.    Review of Systems  All other systems reviewed and are negative.      Objective:   Physical Exam  Constitutional: She is oriented to person, place, and time. She appears well-developed and well-nourished.  Genitourinary:    There is no rash, tenderness, lesion or injury on the right labia. There is no rash, tenderness, lesion or injury on the left labia.  Neurological: She is alert and oriented to person, place, and time.  Skin: Skin is warm and dry.  Psychiatric: She has a normal mood and affect.       Assessment:     Vulvar ulceration     Plan:     Viral PCR testing will be done today.  If negative, will repeat Serology for IgM in two weeks and if this is negative, IgM in 3 months.  Pt in agreement with plan.

## 2015-11-03 LAB — HERPES SIMPLEX VIRUS(HSV) DNA BY PCR
HSV 1 DNA: NOT DETECTED
HSV 2 DNA: NOT DETECTED

## 2015-11-04 NOTE — Addendum Note (Signed)
Addended by: Megan Salon on: 11/04/2015 06:21 PM   Modules accepted: Orders

## 2015-11-11 ENCOUNTER — Other Ambulatory Visit: Payer: BLUE CROSS/BLUE SHIELD

## 2015-11-11 ENCOUNTER — Other Ambulatory Visit: Payer: Self-pay | Admitting: Obstetrics & Gynecology

## 2015-11-11 ENCOUNTER — Telehealth: Payer: Self-pay | Admitting: Obstetrics & Gynecology

## 2015-11-11 DIAGNOSIS — R899 Unspecified abnormal finding in specimens from other organs, systems and tissues: Secondary | ICD-10-CM

## 2015-11-11 NOTE — Telephone Encounter (Signed)
Left message on voicemail regarding missed lab appointment and to call back to reschedule.

## 2015-11-20 ENCOUNTER — Encounter: Payer: Self-pay | Admitting: Obstetrics & Gynecology

## 2016-02-29 ENCOUNTER — Telehealth: Payer: Self-pay | Admitting: Obstetrics & Gynecology

## 2016-02-29 NOTE — Telephone Encounter (Signed)
Left patient a message to call back to reschedule a future appointment that was cancelled by the provider. °

## 2016-04-04 ENCOUNTER — Ambulatory Visit: Payer: BLUE CROSS/BLUE SHIELD | Admitting: Obstetrics & Gynecology

## 2016-04-20 ENCOUNTER — Telehealth: Payer: Self-pay | Admitting: Obstetrics & Gynecology

## 2016-04-20 DIAGNOSIS — R102 Pelvic and perineal pain: Secondary | ICD-10-CM

## 2016-04-20 NOTE — Telephone Encounter (Signed)
Patient would like to schedule an appointment to have her aex and ultrasound on the same day. She is scheduled for Monday 9/25 but is aware that ultrasounds are not done on Mondays.

## 2016-04-20 NOTE — Telephone Encounter (Signed)
I spoke to the patient and she states she spoke with Dr. Sabra Heck about doing both appointments on the same day.  This is OK per Dr. Sabra Heck.   Order entered for pelvic ultrasound with diagnosis of pelvic pain.    Patient is scheduled for Tuesday, 04/25/16 @ 1:30pm for ultrasound with consult/AEX to follow.  Routing to provider for review.

## 2016-04-24 ENCOUNTER — Ambulatory Visit: Payer: BLUE CROSS/BLUE SHIELD | Admitting: Obstetrics & Gynecology

## 2016-04-25 ENCOUNTER — Other Ambulatory Visit: Payer: BLUE CROSS/BLUE SHIELD

## 2016-04-25 ENCOUNTER — Other Ambulatory Visit: Payer: BLUE CROSS/BLUE SHIELD | Admitting: Obstetrics & Gynecology

## 2016-04-27 ENCOUNTER — Ambulatory Visit (INDEPENDENT_AMBULATORY_CARE_PROVIDER_SITE_OTHER): Payer: BLUE CROSS/BLUE SHIELD | Admitting: Obstetrics & Gynecology

## 2016-04-27 ENCOUNTER — Ambulatory Visit (INDEPENDENT_AMBULATORY_CARE_PROVIDER_SITE_OTHER): Payer: BLUE CROSS/BLUE SHIELD

## 2016-04-27 VITALS — BP 134/84 | HR 72 | Resp 14 | Ht 66.0 in | Wt 133.0 lb

## 2016-04-27 DIAGNOSIS — IMO0002 Reserved for concepts with insufficient information to code with codable children: Secondary | ICD-10-CM

## 2016-04-27 DIAGNOSIS — Z0000 Encounter for general adult medical examination without abnormal findings: Secondary | ICD-10-CM

## 2016-04-27 DIAGNOSIS — Z01419 Encounter for gynecological examination (general) (routine) without abnormal findings: Secondary | ICD-10-CM | POA: Diagnosis not present

## 2016-04-27 DIAGNOSIS — R896 Abnormal cytological findings in specimens from other organs, systems and tissues: Secondary | ICD-10-CM | POA: Diagnosis not present

## 2016-04-27 DIAGNOSIS — E538 Deficiency of other specified B group vitamins: Secondary | ICD-10-CM | POA: Diagnosis not present

## 2016-04-27 DIAGNOSIS — R1031 Right lower quadrant pain: Secondary | ICD-10-CM | POA: Diagnosis not present

## 2016-04-27 DIAGNOSIS — Z8742 Personal history of other diseases of the female genital tract: Secondary | ICD-10-CM | POA: Diagnosis not present

## 2016-04-27 DIAGNOSIS — Z124 Encounter for screening for malignant neoplasm of cervix: Secondary | ICD-10-CM

## 2016-04-27 DIAGNOSIS — E162 Hypoglycemia, unspecified: Secondary | ICD-10-CM | POA: Diagnosis not present

## 2016-04-27 DIAGNOSIS — N83201 Unspecified ovarian cyst, right side: Secondary | ICD-10-CM

## 2016-04-27 DIAGNOSIS — Z803 Family history of malignant neoplasm of breast: Secondary | ICD-10-CM | POA: Diagnosis not present

## 2016-04-27 DIAGNOSIS — R102 Pelvic and perineal pain: Secondary | ICD-10-CM

## 2016-04-27 LAB — COMPREHENSIVE METABOLIC PANEL
ALT: 12 U/L (ref 6–29)
AST: 19 U/L (ref 10–35)
Albumin: 4.5 g/dL (ref 3.6–5.1)
Alkaline Phosphatase: 49 U/L (ref 33–115)
BUN: 7 mg/dL (ref 7–25)
CHLORIDE: 102 mmol/L (ref 98–110)
CO2: 26 mmol/L (ref 20–31)
Calcium: 9.3 mg/dL (ref 8.6–10.2)
Creat: 0.6 mg/dL (ref 0.50–1.10)
Glucose, Bld: 83 mg/dL (ref 65–99)
POTASSIUM: 4.3 mmol/L (ref 3.5–5.3)
Sodium: 140 mmol/L (ref 135–146)
TOTAL PROTEIN: 6.6 g/dL (ref 6.1–8.1)
Total Bilirubin: 0.8 mg/dL (ref 0.2–1.2)

## 2016-04-27 LAB — POCT URINALYSIS DIPSTICK
BILIRUBIN UA: NEGATIVE
Glucose, UA: NEGATIVE
KETONES UA: NEGATIVE
Leukocytes, UA: NEGATIVE
Nitrite, UA: NEGATIVE
PH UA: 5
PROTEIN UA: NEGATIVE
RBC UA: NEGATIVE
Urobilinogen, UA: NEGATIVE

## 2016-04-27 LAB — CBC
HCT: 42.8 % (ref 35.0–45.0)
HEMOGLOBIN: 14.7 g/dL (ref 11.7–15.5)
MCH: 30.4 pg (ref 27.0–33.0)
MCHC: 34.3 g/dL (ref 32.0–36.0)
MCV: 88.6 fL (ref 80.0–100.0)
MPV: 8.7 fL (ref 7.5–12.5)
Platelets: 236 10*3/uL (ref 140–400)
RBC: 4.83 MIL/uL (ref 3.80–5.10)
RDW: 12.5 % (ref 11.0–15.0)
WBC: 6.6 10*3/uL (ref 3.8–10.8)

## 2016-04-27 LAB — LIPID PANEL
CHOL/HDL RATIO: 3.6 ratio (ref <=5.0)
Cholesterol: 196 mg/dL (ref 125–200)
HDL: 55 mg/dL (ref >=46)
LDL CALC: 120 mg/dL (ref <130)
Triglycerides: 106 mg/dL (ref <150)
VLDL: 21 mg/dL (ref <30)

## 2016-04-27 LAB — VITAMIN B12: VITAMIN B 12: 268 pg/mL (ref 200–1100)

## 2016-04-27 LAB — TSH: TSH: 2.85 m[IU]/L

## 2016-04-27 MED ORDER — NITROFURANTOIN MONOHYD MACRO 100 MG PO CAPS: ORAL_CAPSULE | ORAL | 1 refills | Status: DC

## 2016-04-27 MED ORDER — METOPROLOL SUCCINATE ER 25 MG PO TB24: 25.0000 mg | ORAL_TABLET | Freq: Every day | ORAL | 12 refills | Status: AC

## 2016-04-27 NOTE — Progress Notes (Deleted)
50 y.o. Sandra Mcgrath MarriedCaucasianF here for annual exam.    No LMP recorded. Patient is not currently having periods (Reason: IUD).          Sexually active: Yes.    The current method of family planning is IUD.    Exercising: Yes.    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 She has never used smokeless tobacco. She reports that she drinks about 1.5 - 2.0 oz of alcohol per week . She reports that she does not use drugs.  Past Medical History:  Diagnosis Date  . Hyperlipidemia   . Hypertension   . 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 Racing heart beat     Past Surgical History:  Procedure Laterality Date  . AUGMENTATION MAMMAPLASTY  07/2012  . CESAREAN SECTION  2/07   35 weeks  . PELVIC LAPAROSCOPY  2006   w/HSG, endometrioma    Current Outpatient Prescriptions  Medication Sig Dispense Refill  . cyanocobalamin (,VITAMIN B-12,) 1000 MCG/ML injection Inject 1,000 mcg into the muscle every 30 (thirty) days.    . 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 30 tablet 0  . levonorgestrel (MIRENA) 20 MCG/24HR IUD 1 each by Intrauterine route once.      . metoprolol succinate (TOPROL-XL) 12.5 mg TB24 Take 12.5 mg by mouth daily.     . nitrofurantoin, macrocrystal-monohydrate, (MACROBID) 100 MG capsule Take as directed 30 capsule 13  . NON FORMULARY Vitamin B with Biotin daily     No current facility-administered medications for this visit.     Family History  Problem Relation Age of Onset  . Hypertension Father 56    alive  . Hypertension Mother 34    alive  . Breast cancer Sister 10    genetic testing neg    ROS:   Pertinent items are noted in HPI.  Otherwise, a comprehensive ROS was negative.  Exam:   BP 134/84 (BP Location: Right Arm, Patient Position: Sitting, Cuff Size: Normal)   Pulse 72   Resp 14   Ht 5\' 6"  (1.676 m)   Wt 133 lb (60.3 kg)   BMI 21.47 kg/m   Weight change: @WEIGHTCHANGE @ Height:   Height: 5\' 6"  (167.6 cm)  Ht Readings from Last 3 Encounters:  04/27/16 5\' 6"  (1.676 m)  10/26/15 5\' 6"  (1.676 m)  10/14/15 5\' 6"  (1.676 m)    General appearance: alert, cooperative and appears stated age Head: Normocephalic, without obvious abnormality, atraumatic Neck: no adenopathy, supple, symmetrical, trachea midline and thyroid {EXAM; THYROID:18604} Lungs: clear to auscultation bilaterally Breasts: {Exam; breast:13139::"normal appearance, no masses or tenderness"} Heart: regular rate and rhythm Abdomen: soft, non-tender; bowel sounds normal; no masses,  no organomegaly Extremities: extremities normal, atraumatic, no cyanosis or edema Skin: Skin color, texture, turgor normal. No rashes or lesions Lymph nodes: Cervical, supraclavicular, and axillary nodes normal. No abnormal inguinal nodes palpated Neurologic: Grossly normal   Pelvic: External genitalia:  no lesions              Urethra:  normal appearing urethra with no masses,  tenderness or lesions              Bartholins and Skenes: normal                 Vagina: normal appearing vagina with normal color and discharge, no lesions              Cervix: {exam; cervix:14595}              Pap taken: {yes no:314532} Bimanual Exam:  Uterus:  {exam; uterus:12215}              Adnexa: {exam; adnexa:12223}               Rectovaginal: Confirms               Anus:  normal sphincter tone, no lesions  Chaperone was present for exam.  A:  Well Woman with normal exam  P:   {plan; gyn:5269::"mammogram","pap smear","return annually or prn"}

## 2016-04-27 NOTE — Progress Notes (Signed)
50 y.o. G72P1 Married CaucasianF here for annual exam.  Has gotten married.  Living in [place].  Having increased RLQ pain for a couple of weeks.  Called to see if PUS could be done same day and appt changed to accommodate this.  Denies vaginal bleeding.  No additional perirectal issues/concerns.  Does have pictures of wedding she shares.  They are lovely!!  Son is now at [employer] in freshman year.  Pt sold practice.  Working part time.  Redefining herself right now.  Questions about additional genetic testing.  Sister with breast cancer, age 43 at time of diagnosis.  Pt doing 3D MMG.  She does not want to do anything else for genetic testing.  Initial tests were negative.  No LMP recorded. Patient is not currently having periods (Reason: IUD).               Sexually active: Yes.    The current method of family planning is IUD.    Exercising: Yes.    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 She has never used smokeless tobacco. She reports that she drinks about 1.5 - 2.0 oz of alcohol per week . She reports that she does not use drugs.  Past Medical History:  Diagnosis Date  . Hyperlipidemia   . Hypertension   . 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 Racing heart beat     Past Surgical History:  Procedure Laterality Date  . AUGMENTATION MAMMAPLASTY  07/2012  . CESAREAN SECTION  2/07   35 weeks  . PELVIC LAPAROSCOPY  2006   w/HSG, endometrioma    Current Outpatient Prescriptions  Medication Sig Dispense Refill  . cyanocobalamin (,VITAMIN B-12,) 1000 MCG/ML injection Inject 1,000 mcg into the muscle every 30 (thirty) days.    . 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 30 tablet 0  . levonorgestrel (MIRENA) 20 MCG/24HR IUD 1 each by Intrauterine route once.      . metoprolol succinate (TOPROL-XL) 12.5 mg TB24 Take 12.5 mg by mouth daily.     . nitrofurantoin, macrocrystal-monohydrate, (MACROBID) 100 MG capsule Take as directed 30 capsule 13  . NON FORMULARY Vitamin B with Biotin daily     No current facility-administered medications for this visit.     Family History  Problem Relation Age of Onset  . Hypertension Father 55    alive  . Hypertension Mother 17    alive  . Breast cancer Sister 54    genetic testing neg    ROS:  Pertinent items are noted in HPI.  Otherwise, a comprehensive ROS was negative.  Exam:   BP 134/84 (BP Location: Right Arm, Patient Position: Sitting, Cuff Size: Normal)   Pulse 72   Resp 14   Ht 5\' 6"  (1.676 m)   Wt 133 lb (60.3 kg)   BMI 21.47 kg/m   Weight change: +6#  Height: 5\' 6"  (167.6 cm)  Ht Readings from Last 3 Encounters:  04/27/16 5\' 6"  (1.676 m)  10/26/15 5\' 6"  (1.676 m)  10/14/15 5\' 6"  (1.676 m)  General appearance: alert, cooperative and appears stated age Head: Normocephalic, without obvious abnormality, atraumatic Neck: no adenopathy, supple, symmetrical, trachea midline and thyroid normal to inspection and palpation Lungs: clear to auscultation bilaterally Breasts: normal appearance, no masses or tenderness, bilateral implants present Heart: regular rate and rhythm Abdomen: soft, non-tender; bowel sounds normal; no masses,  no organomegaly Extremities: extremities normal, atraumatic, no cyanosis or edema Skin: Skin color, texture, turgor normal. No rashes or lesions Lymph nodes: Cervical, supraclavicular, and axillary nodes normal. No abnormal inguinal nodes palpated Neurologic: Grossly normal   Pelvic: External genitalia:  no lesions              Urethra:  normal appearing urethra with no masses, tenderness or lesions              Bartholins and  Skenes: normal                 Vagina: normal appearing vagina with normal color and discharge, no lesions              Cervix: no lesions              Pap taken: Yes.   Bimanual Exam:  Uterus:  normal size, contour, position, consistency, mobility, non-tender              Adnexa: Mild right adnexal fullnes and tenderness, left ovary normal               Rectovaginal: Confirms               Anus:  normal sphincter tone, no lesions  Chaperone was present for exam.  PUS results Uterus:  10.7 x 5.2 x 4.4cm with IUD in correct position Endometrium  Left ovary: 3.5 x 2.0 x 2.0cm with possible scarring from prior surgery or ara of endometriosis Right ovary 3.8 x 2.7 x 2.2cm with [id number] simple folicle Cul de sac;  neg  A:    Well Woman with normal exam IUD for BC/cycle control/pain control due to hx of endometriosis.  Placed 7/13 H/O small endometrioma on ultrasound vs scarring from prior surgery.  Appearance is stable today.   Right [id number] ovarian follicle/mild RLQ pain Recurrent UTIs in the past.  This has improved over the last year. Sister with breast cancer, diagnosed age 21. Pt's sister did have genetic testing but has declined additional, expanded testing at this time H/o B 12 deficiency  H/O LGSIL pap with +HR HPV Perirectal lesions last year without recurrence  P: Mammogram yearly. Doing 3D.  Pap smear neg 6/14. Neg HR HPV testing 2013.  Pap with HR HPV today.  Macrobid 100mg  as directed. #30/1 RF.  (Pt just not needing this very much now) HbA1C and B12 Lipids, TSH, Vit D, Vit D, CMP, CBC  Has current RX for valium which she uses only with flying.  Declines need for RF now. Return annually or prn  ~15 minutes spent with patient, all in face to face discussion of ultrasound findings, family hx of breast cancer and expanded genetic testing.

## 2016-04-27 NOTE — Progress Notes (Deleted)
50 y.o. Anh.Prima MarriedCaucasianF here for annual exam.   No LMP recorded. Patient is not currently having periods (Reason: IUD).          Sexually active: Yes.    The current method of family planning is IUD.    Exercising: Yes.    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 She has never used smokeless tobacco. She reports that she drinks about 1.5 - 2.0 oz of alcohol per week . She reports that she does not use drugs.  Past Medical History:  Diagnosis Date  . Hyperlipidemia   . Hypertension   . 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 Racing heart beat     Past Surgical History:  Procedure Laterality Date  . AUGMENTATION MAMMAPLASTY  07/2012  . CESAREAN SECTION  2/07   35 weeks  . PELVIC LAPAROSCOPY  2006   w/HSG, endometrioma    Current Outpatient Prescriptions  Medication Sig Dispense Refill  . cyanocobalamin (,VITAMIN B-12,) 1000 MCG/ML injection Inject 1,000 mcg into the muscle every 30 (thirty) days.    . 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 30 tablet 0  . levonorgestrel (MIRENA) 20 MCG/24HR IUD 1 each by Intrauterine route once.      . metoprolol succinate (TOPROL-XL) 12.5 mg TB24 Take 12.5 mg by mouth daily.     . nitrofurantoin, macrocrystal-monohydrate, (MACROBID) 100 MG capsule Take as directed 30 capsule 13  . NON FORMULARY Vitamin B with Biotin daily     No current facility-administered medications for this visit.     Family History  Problem Relation Age of Onset  . Hypertension Father 29    alive  . Hypertension Mother 34    alive  . Breast cancer Sister 24    genetic testing neg    ROS:   Pertinent items are noted in HPI.  Otherwise, a comprehensive ROS was negative.  Exam:   BP 134/84 (BP Location: Right Arm, Patient Position: Sitting, Cuff Size: Normal)   Pulse 72   Resp 14   Ht 5\' 6"  (1.676 m)   Wt 133 lb (60.3 kg)   BMI 21.47 kg/m   Weight change: @WEIGHTCHANGE @ Height:   Height: 5\' 6"  (167.6 cm)  Ht Readings from Last 3 Encounters:  04/27/16 5\' 6"  (1.676 m)  10/26/15 5\' 6"  (1.676 m)  10/14/15 5\' 6"  (1.676 m)    General appearance: alert, cooperative and appears stated age Head: Normocephalic, without obvious abnormality, atraumatic Neck: no adenopathy, supple, symmetrical, trachea midline and thyroid {EXAM; THYROID:18604} Lungs: clear to auscultation bilaterally Breasts: {Exam; breast:13139::"normal appearance, no masses or tenderness"} Heart: regular rate and rhythm Abdomen: soft, non-tender; bowel sounds normal; no masses,  no organomegaly Extremities: extremities normal, atraumatic, no cyanosis or edema Skin: Skin color, texture, turgor normal. No rashes or lesions Lymph nodes: Cervical, supraclavicular, and axillary nodes normal. No abnormal inguinal nodes palpated Neurologic: Grossly normal   Pelvic: External genitalia:  no lesions              Urethra:  normal appearing urethra with no masses, tenderness  or lesions              Bartholins and Skenes: normal                 Vagina: normal appearing vagina with normal color and discharge, no lesions              Cervix: {exam; cervix:14595}              Pap taken: {yes no:314532} Bimanual Exam:  Uterus:  {exam; uterus:12215}              Adnexa: {exam; adnexa:12223}               Rectovaginal: Confirms               Anus:  normal sphincter tone, no lesions  Chaperone was present for exam.  A:  Well Woman with normal exam  P:   {plan; gyn:5269::"mammogram","pap smear","return annually or prn"}

## 2016-04-28 LAB — VITAMIN D 25 HYDROXY (VIT D DEFICIENCY, FRACTURES): VIT D 25 HYDROXY: 36 ng/mL (ref 30–100)

## 2016-04-28 LAB — HEMOGLOBIN A1C
Hgb A1c MFr Bld: 4.9 % (ref <5.7)
Mean Plasma Glucose: 94 mg/dL

## 2016-04-29 ENCOUNTER — Encounter: Payer: Self-pay | Admitting: Obstetrics & Gynecology

## 2016-05-01 LAB — IPS PAP TEST WITH HPV

## 2016-05-04 ENCOUNTER — Telehealth: Payer: Self-pay

## 2016-05-04 DIAGNOSIS — R87810 Cervical high risk human papillomavirus (HPV) DNA test positive: Secondary | ICD-10-CM

## 2016-05-04 DIAGNOSIS — R87612 Low grade squamous intraepithelial lesion on cytologic smear of cervix (LGSIL): Secondary | ICD-10-CM

## 2016-05-04 NOTE — Telephone Encounter (Signed)
-----   Message from Megan Salon, MD sent at 05/03/2016  5:35 AM EDT ----- Please call and inform pt her Pap smear was LGSIL and the HR HPV is still present.  Needs colposcopy scheduled.

## 2016-05-04 NOTE — Telephone Encounter (Signed)
Left message to call Timoteo Carreiro at [phone]. 

## 2016-05-04 NOTE — Telephone Encounter (Signed)
Spoke with patient. Advised of message as seen below from [place]. Patient is agreeable and verbalizes understanding. Patient has a Mirena IUD in place. Colposcopy appointment scheduled for 05/16/2016 at 3:30 pm with Dr.Miller.  Instructions given. Motrin 800 mg po x , one hour before appointment with food. Make sure to eat a meal before appointment and drink plenty of fluids. Patient verbalized understanding and will call to reschedule if will be on menses or has any concerns regarding pregnancy.  Patient agreeable and verbalized understanding of all instructions. Order placed for precert.  Routing to provider for final review. Patient agreeable to disposition. Will close encounter.

## 2016-05-16 ENCOUNTER — Ambulatory Visit (INDEPENDENT_AMBULATORY_CARE_PROVIDER_SITE_OTHER): Payer: BLUE CROSS/BLUE SHIELD | Admitting: Obstetrics & Gynecology

## 2016-05-16 ENCOUNTER — Ambulatory Visit: Payer: BLUE CROSS/BLUE SHIELD | Admitting: Obstetrics & Gynecology

## 2016-05-16 ENCOUNTER — Encounter: Payer: Self-pay | Admitting: Obstetrics & Gynecology

## 2016-05-16 DIAGNOSIS — R87810 Cervical high risk human papillomavirus (HPV) DNA test positive: Secondary | ICD-10-CM

## 2016-05-16 DIAGNOSIS — R87612 Low grade squamous intraepithelial lesion on cytologic smear of cervix (LGSIL): Secondary | ICD-10-CM

## 2016-05-16 NOTE — Progress Notes (Signed)
50 y.o. Married Caucasian female here for colposcopy with possible biopsies and/or ECC due to LGSIL Pap obtained at [facility] 04/27/16.    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  ECC was negative.  No LMP recorded. Patient is not currently having periods (Reason: IUD).          Sexually active: Yes.    The current method of family planning is IUD.     Patient has been counseled about results and procedure.  Risks and benefits have bene reviewed including immediate and/or delayed bleeding, infection, cervical scaring from procedure, possibility of needing additional follow up as well as treatment.  rare risks of missing a lesion discussed as well.  All questions answered.  Pt ready to proceed.  BP 120/76 (BP Location: Right Arm, Patient Position: Sitting, Cuff Size: Normal)   Pulse 72   Resp 12   Ht 5' 6.5" (1.689 m)   Wt 131 lb 12.8 oz (59.8 kg)   BMI 20.95 kg/m   Physical Exam  Constitutional: She appears well-developed and well-nourished.  Genitourinary: Vagina normal. There is no rash, tenderness, lesion or injury on the right labia. There is no rash, tenderness, lesion or injury on the left labia.    Lymphadenopathy:       Right: No inguinal adenopathy present.       Left: No inguinal adenopathy present.    Speculum placed.  3% acetic acid applied to cervix for >45 seconds.  Cervix visualized with both 7.5X and 15X magnification.  Green filter also used.  Lugols solution was used.  Findings:  One small area of AWE noted at 6 o'clock.  Biopsy:  Obtained at this location  ECC:  was performed.  Monsel's was needed.  Excellent hemostasis was present.  Pt tolerated procedure well and all instruments were removed.  Findings noted above on picture of cervix.  Assessment:  LGSIL pap  Plan:  Pathology results will be called to patient and follow-up planned pending results.

## 2016-05-18 LAB — IPS OTHER TISSUE BIOPSY

## 2016-08-14 ENCOUNTER — Other Ambulatory Visit: Payer: Self-pay | Admitting: Obstetrics & Gynecology

## 2016-08-14 DIAGNOSIS — Z1231 Encounter for screening mammogram for malignant neoplasm of breast: Secondary | ICD-10-CM

## 2016-09-14 ENCOUNTER — Ambulatory Visit
Admission: RE | Admit: 2016-09-14 | Discharge: 2016-09-14 | Disposition: A | Discharge status: Home / Self Care | Payer: BLUE CROSS/BLUE SHIELD | Source: Ambulatory Visit | Attending: Obstetrics & Gynecology | Admitting: Obstetrics & Gynecology

## 2016-09-14 ENCOUNTER — Ambulatory Visit: Payer: BLUE CROSS/BLUE SHIELD

## 2016-09-14 DIAGNOSIS — Z1231 Encounter for screening mammogram for malignant neoplasm of breast: Secondary | ICD-10-CM

## 2016-11-20 ENCOUNTER — Encounter: Payer: Self-pay | Admitting: Obstetrics & Gynecology

## 2016-11-20 ENCOUNTER — Ambulatory Visit (INDEPENDENT_AMBULATORY_CARE_PROVIDER_SITE_OTHER): Payer: BLUE CROSS/BLUE SHIELD | Admitting: Obstetrics & Gynecology

## 2016-11-20 ENCOUNTER — Other Ambulatory Visit (HOSPITAL_COMMUNITY)
Admission: RE | Admit: 2016-11-20 | Discharge: 2016-11-20 | Disposition: A | Discharge status: Home / Self Care | Payer: BLUE CROSS/BLUE SHIELD | Source: Ambulatory Visit | Attending: Obstetrics & Gynecology | Admitting: Obstetrics & Gynecology

## 2016-11-20 VITALS — BP 120/80 | HR 60 | Resp 12 | Ht 66.5 in | Wt 137.4 lb

## 2016-11-20 DIAGNOSIS — R87612 Low grade squamous intraepithelial lesion on cytologic smear of cervix (LGSIL): Secondary | ICD-10-CM

## 2016-11-20 DIAGNOSIS — Z01411 Encounter for gynecological examination (general) (routine) with abnormal findings: Secondary | ICD-10-CM | POA: Diagnosis not present

## 2016-11-20 MED ORDER — DIAZEPAM 10 MG PO TABS: ORAL_TABLET | ORAL | 0 refills | Status: DC

## 2016-11-20 NOTE — Progress Notes (Signed)
  GYNECOLOGY  VISIT   HPI: 51 y.o. 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 However, accurate grading is difficult due to the detached nature of the fragments  and potential malorientation.   She is here for follow up pap smear and repeat ECC today.  Pt aware specimens will be sent to [facility] and [facility]. [facility].  Will make comparison if needed.  Denies vaginal bleeding  GYNECOLOGIC HISTORY: No LMP recorded. Patient is not currently having periods (Reason: IUD). Contraception: IUD, Mirena  Patient Active Problem List   Diagnosis Date Noted  . IUD (intrauterine device) in place 03/26/2015  . Hypertension 04/25/2011  . PREMATURE VENTRICULAR CONTRACTIONS 04/27/2009  . PALPITATIONS 04/27/2009  . SOB 01/08/2008    Past Medical History:  Diagnosis Date  . Hyperlipidemia   . Hypertension   . 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 Racing heart beat     Past Surgical History:  Procedure Laterality Date  . AUGMENTATION MAMMAPLASTY  07/2012  . CESAREAN SECTION  2/07   35 weeks  . PELVIC LAPAROSCOPY  2006   w/HSG, endometrioma    MEDS:  Reviewed in EPIC and UTD  ALLERGIES: Patient has no known allergies.  Family History  Problem Relation Age of Onset  . Hypertension Father 31    alive  . Hypertension Mother 68    alive  . Breast cancer Sister 63    genetic testing neg    SH:  Married, non smoker  Review of Systems  All other systems reviewed and are negative.   PHYSICAL EXAMINATION:    BP 120/80 (BP Location: Right Arm, Patient Position: Sitting, Cuff Size: Normal)   Pulse 60   Resp 12    Ht 5' 6.5" (1.689 m)   Wt 137 lb 6.4 oz (62.3 kg)   BMI 21.84 kg/m     General appearance: alert, cooperative and appears stated age  Pelvic: External genitalia:  no lesions              Urethra:  normal appearing urethra with no masses, tenderness or lesions              Bartholins and Skenes: normal                 Vagina: normal appearing vagina with normal color and discharge, no lesions              Cervix: no lesions and IUD sting noted              Bimanual Exam:  Uterus:  normal size, contour, position, consistency, mobility, non-tender              Adnexa: no mass, fullness, tenderness              Anus:  no lesions  Chaperone was present for exam.  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   Mirena IUD for contraception Valium 10mg , 1/4 to 1/2 tab prn anxiety with flying.  #30/0RF

## 2016-11-22 ENCOUNTER — Encounter: Payer: Self-pay | Admitting: Obstetrics & Gynecology

## 2016-11-22 LAB — CYTOLOGY - PAP

## 2016-11-24 ENCOUNTER — Ambulatory Visit: Payer: Self-pay | Admitting: Obstetrics & Gynecology

## 2017-04-30 ENCOUNTER — Other Ambulatory Visit (HOSPITAL_COMMUNITY)
Admission: RE | Admit: 2017-04-30 | Discharge: 2017-04-30 | Disposition: A | Discharge status: Home / Self Care | Payer: BLUE CROSS/BLUE SHIELD | Source: Ambulatory Visit | Attending: Obstetrics and Gynecology | Admitting: Obstetrics and Gynecology

## 2017-04-30 ENCOUNTER — Ambulatory Visit: Payer: BLUE CROSS/BLUE SHIELD | Admitting: Obstetrics & Gynecology

## 2017-04-30 ENCOUNTER — Encounter: Payer: Self-pay | Admitting: Obstetrics and Gynecology

## 2017-04-30 ENCOUNTER — Ambulatory Visit (INDEPENDENT_AMBULATORY_CARE_PROVIDER_SITE_OTHER): Payer: BLUE CROSS/BLUE SHIELD | Admitting: Obstetrics and Gynecology

## 2017-04-30 VITALS — BP 118/70 | HR 76 | Resp 16 | Ht 65.5 in | Wt 134.2 lb

## 2017-04-30 DIAGNOSIS — Z01419 Encounter for gynecological examination (general) (routine) without abnormal findings: Secondary | ICD-10-CM | POA: Diagnosis present

## 2017-04-30 DIAGNOSIS — N951 Menopausal and female climacteric states: Secondary | ICD-10-CM | POA: Diagnosis present

## 2017-04-30 DIAGNOSIS — Z3009 Encounter for other general counseling and advice on contraception: Secondary | ICD-10-CM

## 2017-04-30 NOTE — Progress Notes (Signed)
51 y.o. G63P1 Married Caucasian female here for annual exam.    Patient of Dr. Ammie Ferrier.  Dr. Sabra Heck not available today for office visit.  Has Mirena.  No periods. Having some hot flashes.   Hx left endometrioma.  She did 23 and me testing and is BRCA negative.  Sister did have breast cancer.   Remarried.  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  Husband owns [employer] course.   PCP:   Dr. Virgina Jock No LMP recorded. Patient is not currently having periods (Reason: IUD).           Sexually active: Yes.    The current method of family planning is IUD.   Mirena placed July 2013. Exercising: Yes.    tennis and walking Smoker:  no  Health Maintenance: Pap: 10/14/15 LSIL   04/27/16 LSIL with +HRHPV   05/16/16 biopsy showed small area of CIN 1 and ECC was negative  11/20/16 LGSIL with +HR HPV with negative ECC History of abnormal Pap:  yes MMG:  09/14/16 BIRADS 1 negative/density c Colonoscopy:  n/a BMD:   n/a  Result  n/a TDaP:  2009 Gardasil:   no HIV: done in the past -- negative Hep C: done in the past -- negative Screening Labs:  Discuss today   reports that she has never smoked. She has never used smokeless tobacco. She reports that she drinks about 1.5 - 2.0 oz of alcohol per week . She reports that she does not use drugs.  Past Medical History:  Diagnosis Date  . Hyperlipidemia   . Hypertension   . 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 Racing heart beat     Past Surgical History:  Procedure Laterality Date  . AUGMENTATION MAMMAPLASTY  07/2012  . CESAREAN SECTION  2/07   35 weeks  . PELVIC LAPAROSCOPY  2006   w/HSG, endometrioma    Current Outpatient Prescriptions  Medication Sig Dispense Refill  . cyanocobalamin (,VITAMIN B-12,) 1000 MCG/ML injection Inject 1,000 mcg into the muscle every 30 (thirty) days.    . 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 30 tablet 0  .  levonorgestrel (MIRENA) 20 MCG/24HR IUD 1 each by Intrauterine route once.      . metoprolol succinate (TOPROL XL) 25 MG 24 hr tablet Take 1 tablet (25 mg total) by mouth daily. 30 tablet 12  . NON FORMULARY Vitamin B with Biotin daily     No current facility-administered medications for this visit.     Family History  Problem Relation Age of Onset  . Hypertension Father 8       alive  . Hypertension Mother 82       alive  . Breast cancer Sister 24       genetic testing neg    ROS:  Pertinent items are noted in HPI.  Otherwise, a comprehensive ROS was negative.  Exam:   BP 118/70 (BP Location: Right Arm, Patient Position: Sitting, Cuff Size: Normal)   Pulse 76   Resp 16   Ht 5' 5.5" (1.664 m)   Wt 134 lb 3.2 oz (60.9 kg)   BMI 21.99 kg/m     General appearance: alert, cooperative and appears stated age Head: Normocephalic, without obvious abnormality, atraumatic Neck: no adenopathy, supple, symmetrical, trachea midline and thyroid normal to inspection and palpation Lungs: clear to auscultation bilaterally Breasts: bilateral augmentation, no masses  or tenderness, No nipple retraction or dimpling, No nipple discharge or bleeding, No axillary or supraclavicular adenopathy Heart: regular rate and rhythm Abdomen: soft, non-tender; no masses, no organomegaly Extremities: extremities normal, atraumatic, no cyanosis or edema Skin: Skin color, texture, turgor normal. No rashes or lesions Lymph nodes: Cervical, supraclavicular, and axillary nodes normal. No abnormal inguinal nodes palpated Neurologic: Grossly normal  Pelvic: External genitalia:  no lesions              Urethra:  normal appearing urethra with no masses, tenderness or lesions              Bartholins and Skenes: normal                 Vagina: normal appearing vagina with normal color and discharge, no lesions              Cervix: no lesions.  IUD strings not seen.               Pap taken: Yes.   Bimanual Exam:   Uterus:  normal size, contour, position, consistency, mobility, non-tender              Adnexa: no mass, fullness, tenderness              Rectal exam: Yes.  .  Confirms.              Anus:  normal sphincter tone, no lesions  Informal bedside TV ultrasound: IUD in endometrial canal.  Fluid also in endometrial canal.   Chaperone was present for exam.  Assessment:   Well woman visit with normal exam. Menopausal symptoms.  Hx Mirena.  Expired.  Informal ultrasound showing fluid in endometrial canal. Hx LGSIL.  FH breast cancer in sister - premenopausal. Patient BRCA negative by 23 and me testing.   Plan: Mammogram screening discussed. Recommended self breast awareness. Pap and HR HPV as above.   Guidelines for Calcium, Vitamin D, regular exercise program including cardiovascular and weight bearing exercise. Routine labs, FSH and estradiol. Wants to precert IUD exchange and simply cancel appointment if her labs are consistent with menopause.  She may need to return to have a formal pelvic ultrasound.  Follow up annually and prn.   After visit summary provided.

## 2017-04-30 NOTE — Progress Notes (Deleted)
51 y.o. G59P1 Married Caucasian F here for annual exam.    No LMP recorded. Patient is not currently having periods (Reason: IUD).          Sexually active: {yes no:314532}  The current method of family planning is IUD.    Exercising: {yes no:314532}  {types:19826} Smoker:  No   Health Maintenance: Pap:  11/20/16 LGSIL, negative ECC, 04/27/16 LGSIL, HR HPV positive, 10/14/15 LGSIL  History of abnormal Pap:  yes MMG:  09/14/16 BIRADS 1 negative  Colonoscopy:  *** BMD:   *** TDaP:  08/01/07  Pneumonia vaccine(s):  *** Zostavax:   *** Hep C testing: not indicated  Screening Labs: ***, Hb today: ***, Urine today: ***   reports that she has never smoked. She has never used smokeless tobacco. She reports that she drinks about 1.5 - 2.0 oz of alcohol per week . She reports that she does not use drugs.  Past Medical History:  Diagnosis Date  . Hyperlipidemia   . Hypertension   . 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 Racing heart beat     Past Surgical History:  Procedure Laterality Date  . AUGMENTATION MAMMAPLASTY  07/2012  . CESAREAN SECTION  2/07   35 weeks  . PELVIC LAPAROSCOPY  2006   w/HSG, endometrioma    Current Outpatient Prescriptions  Medication Sig Dispense Refill  . cyanocobalamin (,VITAMIN B-12,) 1000 MCG/ML injection Inject 1,000 mcg into the muscle every 30 (thirty) days.    . 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 30 tablet 0  . levonorgestrel (MIRENA) 20 MCG/24HR IUD 1 each by Intrauterine route once.      . metoprolol succinate (TOPROL XL) 25 MG 24 hr tablet Take 1 tablet (25 mg total) by mouth daily. 30 tablet 12  . NON FORMULARY Vitamin B with Biotin daily     No current facility-administered medications for this visit.     Family History  Problem Relation Age of Onset  . Hypertension Father 28       alive  . Hypertension Mother 65       alive  . Breast cancer Sister 11      genetic testing neg    ROS:  Pertinent items are noted in HPI.  Otherwise, a comprehensive ROS was negative.  Exam:   There were no vitals taken for this visit.  Weight change: @WEIGHTCHANGE @ Height:      Ht Readings from Last 3 Encounters:  11/20/16 5' 6.5" (1.689 m)  05/16/16 5' 6.5" (1.689 m)  04/27/16 5\' 6"  (1.676 m)    General appearance: alert, cooperative and appears stated age Head: Normocephalic, without obvious abnormality, atraumatic Neck: no adenopathy, supple, symmetrical, trachea midline and thyroid {EXAM; THYROID:18604} Lungs: clear to auscultation bilaterally Breasts: {Exam; breast:13139::"normal appearance, no masses or tenderness"} Heart: regular rate and rhythm Abdomen: soft, non-tender; bowel sounds normal; no masses,  no organomegaly Extremities: extremities normal, atraumatic, no cyanosis or edema Skin: Skin color, texture, turgor normal. No rashes or lesions Lymph nodes: Cervical, supraclavicular, and axillary nodes normal. No abnormal inguinal nodes palpated Neurologic: Grossly normal   Pelvic: External genitalia:  no lesions              Urethra:  normal appearing urethra with no masses, tenderness or lesions              Bartholins and Skenes: normal  Vagina: normal appearing vagina with normal color and discharge, no lesions              Cervix: {exam; cervix:14595}              Pap taken: {yes no:314532} Bimanual Exam:  Uterus:  {exam; uterus:12215}              Adnexa: {exam; adnexa:12223}               Rectovaginal: Confirms               Anus:  normal sphincter tone, no lesions  Chaperone was present for exam.  A:  Well Woman with normal exam  P:   {plan; gyn:5269::"mammogram","pap smear","return annually or prn"}

## 2017-04-30 NOTE — Patient Instructions (Signed)

## 2017-05-01 LAB — COMPREHENSIVE METABOLIC PANEL
A/G RATIO: 2.1 (ref 1.2–2.2)
ALT: 16 IU/L (ref 0–32)
AST: 25 IU/L (ref 0–40)
Albumin: 4.7 g/dL (ref 3.5–5.5)
Alkaline Phosphatase: 53 IU/L (ref 39–117)
BUN/Creatinine Ratio: 13 (ref 9–23)
BUN: 8 mg/dL (ref 6–24)
Bilirubin Total: 0.7 mg/dL (ref 0.0–1.2)
CALCIUM: 9.3 mg/dL (ref 8.7–10.2)
CO2: 25 mmol/L (ref 20–29)
CREATININE: 0.64 mg/dL (ref 0.57–1.00)
Chloride: 103 mmol/L (ref 96–106)
GFR, EST AFRICAN AMERICAN: 121 mL/min/{1.73_m2} (ref >59)
GFR, EST NON AFRICAN AMERICAN: 105 mL/min/{1.73_m2} (ref >59)
GLOBULIN, TOTAL: 2.2 g/dL (ref 1.5–4.5)
Glucose: 86 mg/dL (ref 65–99)
POTASSIUM: 4.5 mmol/L (ref 3.5–5.2)
Sodium: 142 mmol/L (ref 134–144)
TOTAL PROTEIN: 6.9 g/dL (ref 6.0–8.5)

## 2017-05-01 LAB — CBC
HEMATOCRIT: 42.5 % (ref 34.0–46.6)
Hemoglobin: 14.3 g/dL (ref 11.1–15.9)
MCH: 30.8 pg (ref 26.6–33.0)
MCHC: 33.6 g/dL (ref 31.5–35.7)
MCV: 92 fL (ref 79–97)
PLATELETS: 239 10*3/uL (ref 150–379)
RBC: 4.64 x10E6/uL (ref 3.77–5.28)
RDW: 13.3 % (ref 12.3–15.4)
WBC: 6.2 10*3/uL (ref 3.4–10.8)

## 2017-05-01 LAB — TSH: TSH: 2.67 u[IU]/mL (ref 0.450–4.500)

## 2017-05-01 LAB — ESTRADIOL: ESTRADIOL: 166.6 pg/mL

## 2017-05-01 LAB — LIPID PANEL
CHOLESTEROL TOTAL: 188 mg/dL (ref 100–199)
Chol/HDL Ratio: 3.2 ratio (ref 0.0–4.4)
HDL: 59 mg/dL (ref >39)
LDL Calculated: 108 mg/dL — ABNORMAL HIGH (ref 0–99)
Triglycerides: 107 mg/dL (ref 0–149)
VLDL Cholesterol Cal: 21 mg/dL (ref 5–40)

## 2017-05-01 LAB — FOLLICLE STIMULATING HORMONE: FSH: 5.8 m[IU]/mL

## 2017-05-02 ENCOUNTER — Telehealth: Payer: Self-pay | Admitting: Obstetrics & Gynecology

## 2017-05-02 NOTE — Telephone Encounter (Signed)
Patient called wants to schedule her IUD removal and reinsertion.  Patient was seen on 04/30/17.

## 2017-05-02 NOTE — Telephone Encounter (Signed)
Spoke with patient. Patient would like to go ahead and scheduled Mirena removal and reinsertion. Aware based on lab results may only need removal. Patient is not having menses. Mirena expired July 2018. Appointment scheduled for 05/11/2017 at 3:30 pm with Dr.Miller. Pre procedure instructions given.  Motrin instructions given. Motrin=Advil=Ibuprofen, 800 mg one hour before appointment. Eat a meal and hydrate well before appointment.   Dr.Miller, please review labs from 04/30/2017.

## 2017-05-03 LAB — CYTOLOGY - PAP
Diagnosis: NEGATIVE
HPV (WINDOPATH): DETECTED — AB

## 2017-05-03 NOTE — Telephone Encounter (Signed)
Her [place] was 5.8 so she does need a new IUD.  Ok to close encounter.  Lab work results were sent to pt by Dr. Quincy Simmonds via [facility].  Pap smear is still pending.

## 2017-05-09 ENCOUNTER — Telehealth: Payer: Self-pay | Admitting: *Deleted

## 2017-05-09 ENCOUNTER — Other Ambulatory Visit: Payer: Self-pay | Admitting: *Deleted

## 2017-05-09 DIAGNOSIS — R87810 Cervical high risk human papillomavirus (HPV) DNA test positive: Secondary | ICD-10-CM

## 2017-05-09 MED ORDER — MISOPROSTOL 200 MCG PO TABS: ORAL_TABLET | ORAL | 0 refills | Status: DC

## 2017-05-09 NOTE — Telephone Encounter (Signed)
Routing to Dr. Lestine Box. Will close encounter.    Notes recorded by Burnice Logan, RN on 05/09/2017 at 11:49 AM EDT Spoke with patient, advised as seen below per Dr. Sabra Heck. Rx for cytotec to verified pharmacy on file. Patient states she would like to discuss colpo prior to procedure, is agreeable to same day as IUD exchange after talking with Dr. Sabra Heck. Patient verbalizes understanding and is agreeable.  Order placed for colposcopy. ------  Notes recorded by Megan Salon, MD on 05/08/2017 at 10:21 AM EDT Dr. Quincy Simmonds forwarded me this pt's Pap smear. She saw her last week for me while I was in the OR. Pt's pap is now normal but HR HPV is still positive. As pap still shows HR HPV, she needs a colposcopy as well. She is coming on 10/12 for IUD removal and replacement. I can do the colpo at the same time if she is ok with this. Her strings were not the day of visit with Dr. Quincy Simmonds so I'd like her to use cytotec 243mcg pv pm and am before procedure. This can make her cramp some and feel a little bloated but will help with IUD removal. Thanks. ---  Cc: Lerry Liner

## 2017-05-11 ENCOUNTER — Encounter: Payer: Self-pay | Admitting: Obstetrics & Gynecology

## 2017-05-11 ENCOUNTER — Other Ambulatory Visit: Payer: Self-pay | Admitting: Obstetrics & Gynecology

## 2017-05-11 ENCOUNTER — Ambulatory Visit (INDEPENDENT_AMBULATORY_CARE_PROVIDER_SITE_OTHER): Payer: BLUE CROSS/BLUE SHIELD | Admitting: Obstetrics & Gynecology

## 2017-05-11 VITALS — BP 134/72 | HR 64

## 2017-05-11 DIAGNOSIS — Z30433 Encounter for removal and reinsertion of intrauterine contraceptive device: Secondary | ICD-10-CM

## 2017-05-11 DIAGNOSIS — Z3009 Encounter for other general counseling and advice on contraception: Secondary | ICD-10-CM | POA: Diagnosis not present

## 2017-05-11 DIAGNOSIS — R87810 Cervical high risk human papillomavirus (HPV) DNA test positive: Secondary | ICD-10-CM

## 2017-05-11 DIAGNOSIS — R87612 Low grade squamous intraepithelial lesion on cytologic smear of cervix (LGSIL): Secondary | ICD-10-CM

## 2017-05-11 NOTE — Progress Notes (Signed)
51 y.o. Married G63P1 female here for colposcopy with possible biopsies and/or ECC due to normal Pap with +HR HPV in pt with h/o LGSIL pap smear and CIN1.  She also needs her IUD removed and replaced.    No LMP recorded. Patient is not currently having periods (Reason: IUD).          Sexually active: Yes.    The current method of family planning is IUD.     Patient has been counseled about results and procedure.  Risks and benefits have bene reviewed including immediate and/or delayed bleeding, infection, cervical scaring from procedure, possibility of needing additional follow up as well as treatment.  rare risks of missing a lesion discussed as well.  All questions answered.  Pt ready to proceed.  BP 134/72 (BP Location: Right Arm, Patient Position: Sitting, Cuff Size: Normal)   Pulse 64   Physical Exam  Constitutional: She appears well-developed and well-nourished.  Genitourinary:    Skin: Skin is warm and dry.  Psychiatric: She has a normal mood and affect.    Speculum placed.  3% acetic acid applied to cervix for >45 seconds.  Cervix visualized with both 7.5X and 15X magnification.  Green filter also used.  Lugols solution was used.  Findings:  No AWE and no abnormal staining with Lugol's.  Biopsy:  Not obtianed.  ECC:  was performed.  Monsel's was needed.  Excellent hemostasis was present.  Pt tolerated procedure well and all instruments were removed.  Findings noted above on picture of cervix.  IUD string was noted at the cervical os.  This was grasped with ringed forceps.  IUD removed with one pull.  Cervix cleansed with Betadine x 3.  Then anterior lip of cervix grasped with single toothed tenaculum.  Uterus sounded to 7 1/2 cm.  Mirena IUD in introducer was passed through os, to fundus, then slightly withdrawn before IUD released.  Introducer removed and string cut to 2cm.  Pt tolerated procedure well.  Speculum removed.  Assessment:  H/O CIN 1, LGSIL pap, current pap with positive  HR HPV, currently IUD removal and replacement  Plan:  Pathology results will be called to patient and follow-up planned pending results. This is 4th IUD for pt.  She is aware I typically follow-up in 2 months but as she lives in [place] and knows what is normal, feel comfortable with her calling if she feels any symptoms are abnormal/atypical.

## 2017-08-01 ENCOUNTER — Other Ambulatory Visit: Payer: Self-pay | Admitting: Obstetrics & Gynecology

## 2017-08-01 DIAGNOSIS — Z1231 Encounter for screening mammogram for malignant neoplasm of breast: Secondary | ICD-10-CM

## 2017-08-21 ENCOUNTER — Other Ambulatory Visit: Payer: Self-pay | Admitting: Obstetrics & Gynecology

## 2017-08-21 MED ORDER — DIAZEPAM 10 MG PO TABS: ORAL_TABLET | ORAL | 0 refills | Status: DC

## 2017-09-17 ENCOUNTER — Telehealth: Payer: Self-pay

## 2017-09-17 DIAGNOSIS — K869 Disease of pancreas, unspecified: Secondary | ICD-10-CM

## 2017-09-17 DIAGNOSIS — R109 Unspecified abdominal pain: Secondary | ICD-10-CM

## 2017-09-17 DIAGNOSIS — K769 Liver disease, unspecified: Secondary | ICD-10-CM

## 2017-09-17 NOTE — Telephone Encounter (Signed)
Dr. Fuller Plan received call from patient about abnormal CT scan for abdominal pain.  She has indeterminate liver lesion and indeterminate lesion in pancreas. Per Dr. Fuller Plan patient needs MRI/MRCP to evaluate lesions and then needs follow up with Dr. Fuller Plan.  I left a detailed message for the patient to fax lab work she recently had at her primary as well as a copy of the CT scan.  She is also asked to get a copy of the CT on a disk to bring to the MRI I will schedule with her when she calls back

## 2017-09-17 NOTE — Telephone Encounter (Signed)
I spoke with the patient.  She will get records sent to me.  She will bring a copy of the disk with her to the <MRI to have entered in the system She is notified of MRI/MRCP on 09/25/17 11:20 at 3:15 [address] to arrive at 11:20, 4 hours no solids prior and NPO 1 hour prior.  She is asked to call back for any additional questions or concerns.

## 2017-09-18 ENCOUNTER — Ambulatory Visit: Payer: BLUE CROSS/BLUE SHIELD

## 2017-09-25 ENCOUNTER — Ambulatory Visit
Admission: RE | Admit: 2017-09-25 | Discharge: 2017-09-25 | Disposition: A | Discharge status: Home / Self Care | Payer: BLUE CROSS/BLUE SHIELD | Source: Ambulatory Visit | Attending: Obstetrics & Gynecology | Admitting: Obstetrics & Gynecology

## 2017-09-25 ENCOUNTER — Encounter: Payer: Self-pay | Admitting: Gastroenterology

## 2017-09-25 ENCOUNTER — Ambulatory Visit: Payer: BLUE CROSS/BLUE SHIELD | Admitting: Gastroenterology

## 2017-09-25 ENCOUNTER — Other Ambulatory Visit: Payer: Self-pay | Admitting: Gastroenterology

## 2017-09-25 ENCOUNTER — Ambulatory Visit
Admission: RE | Admit: 2017-09-25 | Discharge: 2017-09-25 | Disposition: A | Discharge status: Home / Self Care | Payer: Self-pay | Source: Ambulatory Visit | Attending: Gastroenterology | Admitting: Gastroenterology

## 2017-09-25 ENCOUNTER — Ambulatory Visit
Admission: RE | Admit: 2017-09-25 | Discharge: 2017-09-25 | Disposition: A | Discharge status: Home / Self Care | Payer: BLUE CROSS/BLUE SHIELD | Source: Ambulatory Visit | Attending: Gastroenterology | Admitting: Gastroenterology

## 2017-09-25 VITALS — BP 120/72 | HR 72 | Ht 65.5 in | Wt 132.0 lb

## 2017-09-25 DIAGNOSIS — R109 Unspecified abdominal pain: Secondary | ICD-10-CM

## 2017-09-25 DIAGNOSIS — D1803 Hemangioma of intra-abdominal structures: Secondary | ICD-10-CM

## 2017-09-25 DIAGNOSIS — R52 Pain, unspecified: Secondary | ICD-10-CM

## 2017-09-25 DIAGNOSIS — K862 Cyst of pancreas: Secondary | ICD-10-CM | POA: Diagnosis not present

## 2017-09-25 DIAGNOSIS — K769 Liver disease, unspecified: Secondary | ICD-10-CM

## 2017-09-25 DIAGNOSIS — R932 Abnormal findings on diagnostic imaging of liver and biliary tract: Secondary | ICD-10-CM | POA: Diagnosis not present

## 2017-09-25 DIAGNOSIS — K869 Disease of pancreas, unspecified: Secondary | ICD-10-CM

## 2017-09-25 DIAGNOSIS — Z1231 Encounter for screening mammogram for malignant neoplasm of breast: Secondary | ICD-10-CM

## 2017-09-25 MED ORDER — GADOBENATE DIMEGLUMINE 529 MG/ML IV SOLN
12.0000 mL | Freq: Once | INTRAVENOUS | Status: AC | PRN
  Administered 2017-09-25: 12 mL via INTRAVENOUS

## 2017-09-25 NOTE — Patient Instructions (Signed)
We will contact you next year to schedule your follow up MRI.   You will be due for a recall colonoscopy in 12/2017. We will send you a reminder in the mail when it gets closer to that time.  Thank you for choosing me and [facility].  Pricilla Riffle. Dagoberto Ligas., MD., [facility] Regal

## 2017-09-25 NOTE — Progress Notes (Signed)
History of Present Illness: This is a 52 year old female referred by Shon Baton, MD for the evaluation of abdominal pain, abnormal CT scan of the liver and pancreas.  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 She resides in [place] however she lived in [place] until about 1 year ago. While vacationing in the [place] 3 weeks ago she developed abdominal pain that she describes as moderate to severe, sometimes stabbing and cramping. She relates a bar-like area of pain from her epigastrium to her suprapubic area.  It was not associated with fevers, nausea, vomiting diarrhea or constipation.  This prompted an evaluation with her PCP in [place].  Blood work was normal including CBC, liver function tests and kidney function tests.  The pain completely resolved after 1 week and has not returned.  There were no other associated gastrointestinal symptoms and she is asymptomatic from a GI perspective today.  Denies weight loss, constipation, diarrhea, change in stool caliber, melena, hematochezia, nausea, vomiting, dysphagia, reflux symptoms, chest pain.  Abdominal ultrasound was performed in [facility] around February 13 showed a several liver lesions, one larger lesion in the left hepatic lobe.  A small gallbladder polyp was suspected.    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  A small right ovarian cyst was also noted.  MRI as below performed today.  Abdominal MRI/MRCP today: 1. The large left lobe of liver lesion corresponds to a benign hemangioma and measured 5.7 cm. Smaller segment 8 hemangioma measuring 7 mm and small liver cysts also noted measuring 3 mm  and 7 mm respectively. No suspicious liver lesions identified. 2. Two small cystic structures within head and neck of pancreas are identified measuring 3 mm and 8 mm respectively. Favor benign cystic lesions of the pancreas. Recommend follow up pre and post contrast MRI/MRCP or pancreatic protocol CT in 1 year. This recommendation follows ACR consensus guidelines: Management of Incidental Pancreatic Cysts: A White Paper of the ACR Incidental Findings Committee. [place] 1610;96:045-409.   No Known Allergies Outpatient Medications Prior to Visit  Medication Sig Dispense Refill  . cyanocobalamin (,VITAMIN B-12,) 1000 MCG/ML injection Inject 1,000 mcg into the muscle every 30 (thirty) days.    . 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 30 tablet 0  . levonorgestrel (MIRENA) 20 MCG/24HR IUD 1 each by Intrauterine route once.      . metoprolol succinate (TOPROL XL) 25 MG 24 hr tablet Take 1 tablet (25 mg total) by mouth daily. 30 tablet 12  . NON FORMULARY Vitamin B with Biotin daily     No facility-administered medications prior to visit.    Past Medical History:  Diagnosis Date  . Hyperlipidemia   . Hypertension   . 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 Racing heart beat    Past Surgical History:  Procedure Laterality Date  . AUGMENTATION MAMMAPLASTY  07/2012  . CESAREAN SECTION  2/07   35 weeks  . PELVIC LAPAROSCOPY  2006   w/HSG, endometrioma   Social History   Socioeconomic History  . Marital status: Married  Spouse name: None  . Number of children: None  . Years of education: None  . Highest education level: None  Social Needs  . Financial resource strain: None  . Food insecurity - worry: None  . Food insecurity - inability: None  . Transportation needs - medical: None  . Transportation needs - non-medical: None  Occupational History  . None  Tobacco Use  . Smoking status:  Never Smoker  . Smokeless tobacco: Never Used  Substance and Sexual Activity  . Alcohol use: Yes    Alcohol/week: 1.5 - 2.0 oz    Types: 3 - 4 drink(s) per week  . Drug use: No  . Sexual activity: Yes    Partners: Male    Birth control/protection: IUD  Other Topics Concern  . None  Social History Narrative  . None   Family History  Problem Relation Age of Onset  . Hypertension Father 26       alive  . Hypertension Mother 32       alive  . Breast cancer Sister 64       genetic testing neg      Review of Systems: Pertinent positive and negative review of systems were noted in the above HPI section. All other review of systems were otherwise negative.    Physical Exam: General: Well developed, well nourished, no acute distress Head: Normocephalic and atraumatic Eyes:  sclerae anicteric, EOMI Ears: Normal auditory acuity Mouth: No deformity or lesions Neck: Supple, no masses or thyromegaly Lungs: Clear throughout to auscultation Heart: Regular rate and rhythm; no murmurs, rubs or bruits Abdomen: Soft, non tender and non distended. No masses, hepatosplenomegaly or hernias noted. Normal Bowel sounds Rectal: not done Musculoskeletal: Symmetrical with no gross deformities  Skin: No lesions on visible extremities Pulses:  Normal pulses noted Extremities: No clubbing, cyanosis, edema or deformities noted Neurological: Alert oriented x 4, grossly nonfocal Cervical Nodes:  No significant cervical adenopathy Inguinal Nodes: No significant inguinal adenopathy Psychological:  Alert and cooperative. Normal mood and affect  Assessment and Recommendations:  1.  Abdominal pain for 1 week of unclear etiology.  It is difficult to associate her abdominal pain with any of the MR findings. Expectant management.  Larger hemangiomas are potential source of pain.  If pain returns consider repeat imaging and EGD for further evaluation.  2. Large left hepatic lobe hemangioma measuring 5.7 cm  on MRI.   We discussed potential complications of large (>5 cm) and subcapsilar hepatic hemangiomas.  I addressed her questions to her satisfaction.  Reassess in 1 year with repeat MR.  3.  Small hepatic hemangioma and 2 small hepatic cysts - no further follow-up needed.  Patient reassured.   4.  Two small pancreatic cysts as described above.  Patient reassured is these are likely unremarkable benign cysts.  In addition the MR did not reveal an annular pancreas as was read on her CT. I contacted the Dr. Clovis Riley, the radiologist who read the MRI, by phone today to have him review the images again and he felt there was no evidence to suggest an annular pancreas on MR. Patient reassured as to the lack of finding of an annular pancreas.  Recommend repeat MRI/MRCP in 1 year.  5. CRC screening, average risk. Colonoscopy at age 76 in 12/2017.   cc: Shon Baton, MD [address] [place], [place] [id number]

## 2017-10-03 ENCOUNTER — Ambulatory Visit: Payer: BLUE CROSS/BLUE SHIELD | Admitting: Gastroenterology

## 2017-12-15 ENCOUNTER — Encounter: Payer: Self-pay | Admitting: Obstetrics & Gynecology

## 2017-12-17 ENCOUNTER — Telehealth: Payer: Self-pay | Admitting: Obstetrics & Gynecology

## 2017-12-17 NOTE — Telephone Encounter (Signed)
Patient sent the following correspondence through [facility]. Routing to triage for FYI.  ----- Message from [place], Generic sent at 12/15/2017 2:26 PM EDT -----    Dr Sabra Heck  Would it be possible for me to see you on Tues or Thursday of this coming week? I have a feeling something has dropped that shouldn't have.Marland Kitchen...which is concerning after my mother has had that issue.  Sandra Mcgrath  [phone]   I called the patient this morning and left her a message to call back and schedule an appointment.

## 2017-12-17 NOTE — Telephone Encounter (Signed)
Patient left message returning call to [facility], requesting an appointment with Dr. Sabra Heck for tomorrow.

## 2017-12-17 NOTE — Telephone Encounter (Signed)
Spoke with patient. Patient would like to schedule an appointment to see Dr.Miller. Does not wish to provide additional information. Appointment scheduled for tomorrow at 11:!5 am with Dr.Miller.  Routing to provider for final review. Patient agreeable to disposition. Will close encounter.

## 2017-12-18 ENCOUNTER — Other Ambulatory Visit: Payer: Self-pay

## 2017-12-18 ENCOUNTER — Encounter: Payer: Self-pay | Admitting: Obstetrics & Gynecology

## 2017-12-18 ENCOUNTER — Ambulatory Visit: Payer: BLUE CROSS/BLUE SHIELD | Admitting: Obstetrics & Gynecology

## 2017-12-18 VITALS — BP 132/80 | HR 64 | Resp 16 | Ht 65.5 in | Wt 133.2 lb

## 2017-12-18 DIAGNOSIS — N898 Other specified noninflammatory disorders of vagina: Secondary | ICD-10-CM

## 2017-12-18 MED ORDER — FLUCONAZOLE 150 MG PO TABS: ORAL_TABLET | ORAL | 0 refills | Status: DC

## 2017-12-18 NOTE — Progress Notes (Signed)
affGYNECOLOGY  VISIT  CC:   Concerns about possible prolapse  HPI: 52 y.o. G49P1 Married Caucasian female here for possible vaginal bulge.  Her mother has pelvic floor prolapse and she has concerns about this being an issue for her as well.  She noted some swelling that started last week.  There was some irritation as well.  She thought this might be yeast and used tea tree oil after looking online at some homepathic options.  She did not have much discharge.  The major issue was redness and itching.    Symptoms are improved but due to the swelling/bulge she wanted to just be assessed.    Recent medical issues:  Has new PCP in [place] and had CT scan at [facility].  Had an abnormla finding on the liver.  Then she saw Dr. Fuller Plan.  Had MRI that showed a large hemangioma.    During this time, likely due to concerns about liver finding, she started having PVCs.  Ended up wearing a Holter monitor and this showed just PVCs for 24 hours.  This is much better now.  She felt the monitoring was very reassuring.    GYNECOLOGIC HISTORY: No LMP recorded. (Menstrual status: IUD). Contraception: IUD  Menopausal hormone therapy: none  Patient Active Problem List   Diagnosis Date Noted  . IUD (intrauterine device) in place 03/26/2015  . Hypertension 04/25/2011  . PREMATURE VENTRICULAR CONTRACTIONS 04/27/2009  . PALPITATIONS 04/27/2009    Past Medical History:  Diagnosis Date  . Hemangioma of liver   . Hyperlipidemia   . Hypertension   . 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 PVC's (premature ventricular contractions)     Past Surgical History:  Procedure Laterality Date  . AUGMENTATION MAMMAPLASTY  07/2012  . CESAREAN SECTION  2/07   35 weeks  . PELVIC LAPAROSCOPY  2006   w/HSG, endometrioma    MEDS:   Current Outpatient Medications on File Prior to Visit  Medication Sig Dispense Refill  . cyanocobalamin (,VITAMIN B-12,) 1000 MCG/ML injection Inject 1,000 mcg into the  muscle every 30 (thirty) days.    Marland Kitchen levonorgestrel (MIRENA) 20 MCG/24HR IUD 1 each by Intrauterine route once.      . metoprolol succinate (TOPROL XL) 25 MG 24 hr tablet Take 1 tablet (25 mg total) by mouth daily. 30 tablet 12  . NON FORMULARY Vitamin B with Biotin daily    . 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 (Patient not taking: Reported on 12/18/2017) 30 tablet 0   No current facility-administered medications on file prior to visit.     ALLERGIES: Patient has no known allergies.  Family History  Problem Relation Age of Onset  . Hypertension Father 32       alive  . Hypertension Mother 60       alive  . Breast cancer Sister 4       genetic testing neg    SH:  Married, non smoker  Review of Systems  All other systems reviewed and are negative.   PHYSICAL EXAMINATION:    BP 132/80 (BP Location: Right Arm, Patient Position: Sitting, Cuff Size: Normal)   Pulse 64   Resp 16   Ht 5' 5.5" (1.664 m)   Wt 133 lb 3.2 oz (60.4 kg)   BMI 21.83 kg/m     General appearance: alert, cooperative and appears stated age Lymph:  No  inguinal LAD  Pelvic: External genitalia:  no lesions              Urethra:  normal appearing urethra with no masses, tenderness or lesions              Bartholins and Skenes: normal                 Vagina: normal appearing vagina with normal color and mild amount of whitish discharge, no lesions, no evidence of cystocele or rectocele              Cervix: no lesions and iud string noted              Anus:  no lesions  Chaperone was present for exam.  Assessment: Likely recent vaginitis that has been partly or fully treated with tea tree oil  Plan: Affirm pending.  If any positive testing is present, will notify and treat. Rx for Diflucan 150mg  po x 1, repeat 72 hours given.  #2/0RF

## 2017-12-19 LAB — VAGINITIS/VAGINOSIS, DNA PROBE
Candida Species: NEGATIVE
Gardnerella vaginalis: POSITIVE — AB
Trichomonas vaginosis: NEGATIVE

## 2017-12-21 ENCOUNTER — Encounter: Payer: Self-pay | Admitting: Obstetrics & Gynecology

## 2018-06-02 ENCOUNTER — Encounter: Payer: Self-pay | Admitting: Gastroenterology

## 2018-06-07 ENCOUNTER — Ambulatory Visit: Payer: BLUE CROSS/BLUE SHIELD | Admitting: Obstetrics & Gynecology

## 2018-06-26 ENCOUNTER — Encounter: Payer: Self-pay | Admitting: Family Medicine

## 2018-06-26 ENCOUNTER — Ambulatory Visit: Payer: BLUE CROSS/BLUE SHIELD | Admitting: Family Medicine

## 2018-06-26 ENCOUNTER — Encounter: Payer: Self-pay | Admitting: Gastroenterology

## 2018-06-26 VITALS — BP 138/78 | HR 72 | Ht 65.5 in | Wt 138.0 lb

## 2018-06-26 DIAGNOSIS — L570 Actinic keratosis: Secondary | ICD-10-CM

## 2018-06-26 NOTE — Patient Instructions (Signed)
Keep areas cleansed with alcohol and patient forewarned that blisters may form where the cryotherapy was done. She may need to come back for additional cryotherapy if lesions persist.

## 2018-06-26 NOTE — Progress Notes (Signed)
   Subjective:    Patient ID: Sandra Mcgrath, female    DOB: 11-Sep-1965, 52 y.o.   MRN: [id number]  HPI  Patient here today for skin lesion removal.  Patient has several flat and slightly raised nevi that appear to be benign but are irritated because they are under her bra.  We will do cryotherapy on these lesions and she will get back in touch with [place] if they continue to give her trouble.   Patient Active Problem List   Diagnosis Date Noted  . IUD (intrauterine device) in place 03/26/2015  . Hypertension 04/25/2011  . PREMATURE VENTRICULAR CONTRACTIONS 04/27/2009  . PALPITATIONS 04/27/2009   Outpatient Encounter Medications as of 06/26/2018  Medication Sig  . cyanocobalamin (,VITAMIN B-12,) 1000 MCG/ML injection Inject 1,000 mcg into the muscle every 30 (thirty) days.  Marland Kitchen levonorgestrel (MIRENA) 20 MCG/24HR IUD 1 each by Intrauterine route once.    . metoprolol succinate (TOPROL XL) 25 MG 24 hr tablet Take 1 tablet (25 mg total) by mouth daily.  . NON FORMULARY Vitamin B with Biotin daily  . 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 (Patient not taking: Reported on 12/18/2017)  . [DISCONTINUED] fluconazole (DIFLUCAN) 150 MG tablet 1 tab po x 1, repeat 72 hours   No facility-administered encounter medications on file as of 06/26/2018.      Review of Systems  Constitutional: Negative.   HENT: Negative.   Eyes: Negative.   Respiratory: Negative.   Cardiovascular: Negative.   Gastrointestinal: Negative.   Endocrine: Negative.   Genitourinary: Negative.   Musculoskeletal: Negative.   Skin: Negative.        Lesions left side   Allergic/Immunologic: Negative.   Neurological: Negative.   Hematological: Negative.   Psychiatric/Behavioral: Negative.        Objective:   Physical Exam  BP 138/78 (BP Location: Left Arm)   Pulse 72   Ht 5' 5.5" (1.664 m)   Wt 138 lb (62.6 kg)   BMI 22.62 kg/m   Cryotherapy of  irritated keratoses left chest wall under bra strap.  Patient tolerated procedure well.     Assessment & Plan:  1. Keratosis -There are several small keratotic lesions that cryotherapy was done on and patient tolerated procedure well.  Patient Instructions  Keep areas cleansed with alcohol and patient forewarned that blisters may form where the cryotherapy was done. She may need to come back for additional cryotherapy if lesions persist.  Arrie Senate MD

## 2018-07-09 ENCOUNTER — Other Ambulatory Visit: Payer: Self-pay

## 2018-07-09 ENCOUNTER — Ambulatory Visit: Payer: BLUE CROSS/BLUE SHIELD | Admitting: Obstetrics & Gynecology

## 2018-07-09 ENCOUNTER — Encounter: Payer: Self-pay | Admitting: Obstetrics & Gynecology

## 2018-07-09 ENCOUNTER — Other Ambulatory Visit (HOSPITAL_COMMUNITY)
Admission: RE | Admit: 2018-07-09 | Discharge: 2018-07-09 | Disposition: A | Discharge status: Home / Self Care | Payer: BLUE CROSS/BLUE SHIELD | Source: Ambulatory Visit | Attending: Obstetrics & Gynecology | Admitting: Obstetrics & Gynecology

## 2018-07-09 VITALS — BP 118/62 | HR 76 | Resp 16 | Ht 65.5 in | Wt 138.0 lb

## 2018-07-09 DIAGNOSIS — N926 Irregular menstruation, unspecified: Secondary | ICD-10-CM | POA: Diagnosis not present

## 2018-07-09 DIAGNOSIS — Z01419 Encounter for gynecological examination (general) (routine) without abnormal findings: Secondary | ICD-10-CM | POA: Diagnosis not present

## 2018-07-09 DIAGNOSIS — Z124 Encounter for screening for malignant neoplasm of cervix: Secondary | ICD-10-CM | POA: Insufficient documentation

## 2018-07-09 DIAGNOSIS — R87612 Low grade squamous intraepithelial lesion on cytologic smear of cervix (LGSIL): Secondary | ICD-10-CM

## 2018-07-09 DIAGNOSIS — Z803 Family history of malignant neoplasm of breast: Secondary | ICD-10-CM

## 2018-07-09 MED ORDER — VALIUM 10 MG PO TABS: 10.0000 mg | ORAL_TABLET | Freq: Three times a day (TID) | ORAL | 0 refills | Status: DC | PRN

## 2018-07-09 NOTE — Progress Notes (Signed)
52 y.o. G68P1 Married White or Caucasian female here for annual exam.  Doing well.  Son is a Paramedic at [employer].  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    In October, reports she had a period of time when she was having increased headaches with elevated BPs of 150's/90's.  Is on troprol and increased it.  Four days into this "episode of her headache and BP issues, she started having spotting that lasted for several days.  Once the bleeding stopped, the headache resolved.     Being followed by Dr. Fuller Plan in February, 2019 showing a hemangioma in liver.  Two small cystic structures in the head and neck of the pancreas were again seen and stable.    PCP:  Dr. Peyton Najjar.  In [place].  Doing lab work there.  Patient's last menstrual period was 05/18/2018.          Sexually active: Yes.    The current method of family planning is IUD. Mirena placed 05/11/17   Exercising: Yes.    walking and tennis Smoker:  no  Health Maintenance: Pap:  04/30/17 Neg. HR HPV:+detected. ECC normal  11/20/16 LSIL.   History of abnormal Pap:  yes MMG:  09/25/17 BIRADS1:Neg Colonoscopy:  Is considering cologuard vs colonoscopy BMD:   n/a TDaP:  2009, aware this is due.  Declines today. Pneumonia vaccine(s):  none Shingrix:   none Hep C testing: never Screening Labs: done with PCP today   reports that she has never smoked. She has never used smokeless tobacco. She reports that she drinks about 3.0 - 4.0 standard drinks of alcohol per week. She reports that she does not use drugs.  Past Medical History:  Diagnosis Date  . Hemangioma of liver   . Hyperlipidemia   . Hypertension   . 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 PVC's (premature ventricular contractions)     Past Surgical History:  Procedure Laterality Date  . AUGMENTATION MAMMAPLASTY  07/2012  . CESAREAN SECTION  2/07   35 weeks  . PELVIC LAPAROSCOPY  2006   w/HSG, endometrioma    Current Outpatient  Medications  Medication Sig Dispense Refill  . cyanocobalamin (,VITAMIN B-12,) 1000 MCG/ML injection Inject 1,000 mcg into the muscle every 30 (thirty) days.    Marland Kitchen levonorgestrel (MIRENA) 20 MCG/24HR IUD 1 each by Intrauterine route once.      . metoprolol succinate (TOPROL XL) 25 MG 24 hr tablet Take 1 tablet (25 mg total) by mouth daily. 30 tablet 12  . NON FORMULARY Vitamin B with Biotin daily    . 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 (Patient not taking: Reported on 07/09/2018) 30 tablet 0   No current facility-administered medications for this visit.     Family History  Problem Relation Age of Onset  . Hypertension Father 69       alive  . Hypertension Mother 20       alive  . Breast cancer Sister 53       genetic testing neg    Review of Systems  Constitutional: Negative.   HENT: Negative.   Eyes: Negative.   Respiratory: Negative.   Cardiovascular: Negative.   Gastrointestinal: Negative.   Endocrine: Negative.   Genitourinary: Negative.   Musculoskeletal: Negative.   Skin: Negative.   Allergic/Immunologic: Negative.   Neurological: Negative.   Hematological:  Negative.   Psychiatric/Behavioral: Negative.     Exam:   BP 118/62 (BP Location: Right Arm, Patient Position: Sitting, Cuff Size: Normal)   Pulse 76   Resp 16   Ht 5' 5.5" (1.664 m)   Wt 138 lb (62.6 kg)   LMP 05/18/2018   BMI 22.62 kg/m   Height: 5' 5.5" (166.4 cm)  Ht Readings from Last 3 Encounters:  07/09/18 5' 5.5" (1.664 m)  06/26/18 5' 5.5" (1.664 m)  12/18/17 5' 5.5" (1.664 m)    General appearance: alert, cooperative and appears stated age Head: Normocephalic, without obvious abnormality, atraumatic Neck: no adenopathy, supple, symmetrical, trachea midline and thyroid normal to inspection and palpation Lungs: clear to auscultation bilaterally Breasts: normal appearance, no masses or tenderness, bilateral implants  present Heart: regular rate and rhythm Abdomen: soft, non-tender; bowel sounds normal; no masses,  no organomegaly Extremities: extremities normal, atraumatic, no cyanosis or edema Skin: Skin color, texture, turgor normal. No rashes or lesions Lymph nodes: Cervical, supraclavicular, and axillary nodes normal. No abnormal inguinal nodes palpated Neurologic: Grossly normal   Pelvic: External genitalia:  no lesions              Urethra:  normal appearing urethra with no masses, tenderness or lesions              Bartholins and Skenes: normal                 Vagina: normal appearing vagina with normal color and discharge, no lesions              Cervix: no lesions, IUD string noted              Pap taken: Yes.   Bimanual Exam:  Uterus:  normal size, contour, position, consistency, mobility, non-tender              Adnexa: normal adnexa and no mass, fullness, tenderness               Rectovaginal: Confirms               Anus:  normal sphincter tone, no lesions  Chaperone was present for exam.  A:  Well Woman with normal exam IUD for BC/cycle control placed 2018 H/o small endometrioma on ultrasound vs scarring from prior surgery.  Last PUS 2017. Sister with breast cancer, age 75.  Doing well. H/o LSGIL pap smear with +HR HPV  P:   Mammogram guidelines reviewed.  Doing 3D.  TCM risk for breast cancer, lifetime, slightly >10%.  At this time, no indication for screening MRI pap smear with HR HPV obtained today Rx for Valium 10mg , 1/4 to 1/2 tab prior to flying.  #30/0RF Lab work will be done with Dr. Peyton Najjar in [place] for cologuard placed.   Considering Shingrix vaccination.  Information provided. [place] will be obtained today to assess hormonal status. return annually or prn

## 2018-07-09 NOTE — Patient Instructions (Addendum)
Recombinant Zoster (Shingles) Vaccine, RZV: What You Need to Know    1. Why get vaccinated?  Shingles (also called herpes zoster, or just zoster) is a painful skin rash, often with blisters. Shingles is caused by the varicella zoster virus, the same virus that causes chickenpox. After you have chickenpox, the virus stays in your body and can cause shingles later in life.  You can't catch shingles from another person. However, a person who has never had chickenpox (or chickenpox vaccine) could get chickenpox from someone with shingles.  A shingles rash usually appears on one side of the face or body and heals within 2 to 4 weeks. Its main symptom is pain, which can be severe. Other symptoms can include fever, headache, chills and upset stomach. Very rarely, a shingles infection can lead to pneumonia, hearing problems, blindness, brain inflammation (encephalitis), or death.  For about 1 person in 5, severe pain can continue even long after the rash has cleared up. This long-lasting pain is called post-herpetic neuralgia (PHN).  Shingles is far more common in people 50 years of age and older than in younger people, and the risk increases with age. It is also more common in people whose immune system is weakened because of a disease such as cancer, or by drugs such as steroids or chemotherapy.  At least 1 million people a year in the [place] get shingles.    2. Shingles vaccine (recombinant)  Recombinant shingles vaccine was approved by FDA in 2017 for the prevention of shingles. In clinical trials, it was more than 90% effective in preventing shingles. It can also reduce the likelihood of PHN.  Two doses, 2 to 6 months apart, are recommended for adults 50 and older.  This vaccine is also recommended for people who have already gotten the live shingles vaccine (Zostavax). There is no live virus in this vaccine.    3. Some people should not get this vaccine  Tell your vaccine provider if you:   Have any  severe, life-threatening allergies. A person who has ever had a life-threatening allergic reaction after a dose of recombinant shingles vaccine, or has a severe allergy to any component of this vaccine, may be advised not to be vaccinated. Ask your health care provider if you want information about vaccine components.   Are pregnant or breastfeeding. There is not much information about use of recombinant shingles vaccine in pregnant or nursing women. Your healthcare provider might recommend delaying vaccination.   Are not feeling well. If you have a mild illness, such as a cold, you can probably get the vaccine today. If you are moderately or severely ill, you should probably wait until you recover. Your doctor can advise you.    4. Risks of a vaccine reaction  With any medicine, including vaccines, there is a chance of reactions.  After recombinant shingles vaccination, a person might experience:   Pain, redness, soreness, or swelling at the site of the injection   Headache, muscle aches, fever, shivering, fatigue    In clinical trials, most people got a sore arm with mild or moderate pain after vaccination, and some also had redness and swelling where they got the shot. Some people felt tired, had muscle pain, a headache, shivering, fever, stomach pain, or nausea. About 1 out of 6 people who got recombinant zoster vaccine experienced side effects that prevented them from doing regular activities. Symptoms went away on their own in about 2 to 3 days. Side effects were more   common in younger people.  You should still get the second dose of recombinant zoster vaccine even if you had one of these reactions after the first dose.    Other things that could happen after this vaccine:   People sometimes faint after medical procedures, including vaccination. Sitting or lying down for about 15 minutes can help prevent fainting and injuries caused by a fall. Tell your provider if you feel dizzy or have vision changes or  ringing in the ears.   Some people get shoulder pain that can be more severe and longer-lasting than routine soreness that can follow injections. This happens very rarely.   Any medication can cause a severe allergic reaction. Such reactions to a vaccine are estimated at about 1 in a million doses, and would happen within a few minutes to a few hours after the vaccination.  As with any medicine, there is a very remote chance of a vaccine causing a serious injury or death.  The safety of vaccines is always being monitored. For more information, visit: www.cdc.gov/vaccinesafety/    5. What if there is a serious problem?  What should I look for?   Look for anything that concerns you, such as signs of a severe allergic reaction, very high fever, or unusual behavior.  Signs of a severe allergic reaction can include hives, swelling of the face and throat, difficulty breathing, a fast heartbeat, dizziness, and weakness. These would usually start a few minutes to a few hours after the vaccination.    What should I do?   If you think it is a severe allergic reaction or other emergency that can't wait, call 9-1-1 and get to the nearest hospital. Otherwise, call your health care provider.  Afterward, the reaction should be reported to the Vaccine Adverse Event Reporting System (VAERS). Your doctor should file this report, or you can do it yourself through the VAERS web site atwww.vaers.hhs.govor by calling 1-800-822-7967.  VAERS does not give medical advice.    6. How can I learn more?   Ask your healthcare provider. He or she can give you the vaccine package insert or suggest other sources of information.   Call your local or state health department.   Contact the Centers for Disease Control and Prevention (CDC):  ? Call 1-800-232-4636 (1-800-CDC-INFO) or  ? Visit the CDC's website at www.cdc.gov/vaccines  ?   CDC Vaccine Information Statement (VIS) Recombinant Zoster Vaccine (09/11/2016)  This information is not  intended to replace advice given to you by your health care provider. Make sure you discuss any questions you have with your health care provider.  Document Released: 09/26/2016 Document Revised: 09/26/2016 Document Reviewed: 09/26/2016  Elsevier Interactive Patient Education  2018 Elsevier Inc.

## 2018-07-10 LAB — FOLLICLE STIMULATING HORMONE: FSH: 9.8 m[IU]/mL

## 2018-07-14 ENCOUNTER — Encounter: Payer: Self-pay | Admitting: Obstetrics & Gynecology

## 2018-07-14 DIAGNOSIS — Z803 Family history of malignant neoplasm of breast: Secondary | ICD-10-CM | POA: Insufficient documentation

## 2018-07-19 ENCOUNTER — Encounter

## 2018-07-19 ENCOUNTER — Ambulatory Visit: Payer: BLUE CROSS/BLUE SHIELD | Admitting: Obstetrics & Gynecology

## 2018-07-19 LAB — CYTOLOGY - PAP
DIAGNOSIS: NEGATIVE
HPV (WINDOPATH): DETECTED — AB
HPV 16/18/45 GENOTYPING: NEGATIVE

## 2018-07-22 ENCOUNTER — Other Ambulatory Visit: Payer: Self-pay | Admitting: *Deleted

## 2018-07-22 DIAGNOSIS — R87810 Cervical high risk human papillomavirus (HPV) DNA test positive: Secondary | ICD-10-CM

## 2018-08-02 ENCOUNTER — Telehealth: Payer: Self-pay | Admitting: Obstetrics & Gynecology

## 2018-08-02 NOTE — Telephone Encounter (Signed)
Call placed to convey benefits for colposcopy. °

## 2018-08-19 ENCOUNTER — Encounter: Payer: BLUE CROSS/BLUE SHIELD | Admitting: Gastroenterology

## 2018-08-30 ENCOUNTER — Ambulatory Visit (INDEPENDENT_AMBULATORY_CARE_PROVIDER_SITE_OTHER): Payer: BLUE CROSS/BLUE SHIELD | Admitting: Obstetrics & Gynecology

## 2018-08-30 ENCOUNTER — Encounter: Payer: Self-pay | Admitting: Obstetrics & Gynecology

## 2018-08-30 DIAGNOSIS — R87810 Cervical high risk human papillomavirus (HPV) DNA test positive: Secondary | ICD-10-CM | POA: Diagnosis not present

## 2018-08-30 NOTE — Patient Instructions (Signed)

## 2018-08-30 NOTE — Progress Notes (Signed)
53 y.o. Married caucasian female here for colposcopy with possible biopsies and/or ECC due to normal with neg HR HPV obtained Pap obtained 07/09/18.  Guidelines reviewed.   Also has small vulvar bump/lesion she would like me to check.  Noted it yesterday.  Feels like it is smaller today.  No LMP recorded. (Menstrual status: IUD).          Sexually active: Yes.    The current method of family planning is IUD.     Patient has been counseled about results and procedure.  Risks and benefits have bene reviewed including immediate and/or delayed bleeding, infection, cervical scaring from procedure, possibility of needing additional follow up as well as treatment.  rare risks of missing a lesion discussed as well.  All questions answered.  Pt ready to proceed.  BP (!) 142/86 (BP Location: Right Arm, Patient Position: Sitting, Cuff Size: Normal)   Pulse 76   Resp 16   Ht 5' 5.5" (1.664 m)   Wt 139 lb 12.8 oz (63.4 kg)   BMI 22.91 kg/m   Physical Exam  Constitutional: She appears well-developed and well-nourished.  Genitourinary:    Vagina normal.     No labial fusion. There is no rash, tenderness, lesion or injury on the right labia. There is lesion on the left labia. There is no rash, tenderness or injury on the left labia.     Lymphadenopathy:       Right: No inguinal adenopathy present.       Left: No inguinal adenopathy present.    Speculum placed.  3% acetic acid applied to cervix for >45 seconds.  Cervix visualized with both 7.5X and 15X magnification.  Green filter also used.  Lugols solution was used.  Findings:  No lesions noted.  Biopsy:  none.  ECC:  was performed.  Monsel's was not needed.  Excellent hemostasis was present.  Pt tolerated procedure well and all instruments were removed.  Findings noted above on picture of cervix.  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

## 2018-09-03 ENCOUNTER — Telehealth: Payer: Self-pay | Admitting: Gastroenterology

## 2018-09-03 ENCOUNTER — Other Ambulatory Visit: Payer: Self-pay | Admitting: Obstetrics & Gynecology

## 2018-09-03 ENCOUNTER — Other Ambulatory Visit: Payer: Self-pay

## 2018-09-03 DIAGNOSIS — D1803 Hemangioma of intra-abdominal structures: Secondary | ICD-10-CM

## 2018-09-03 DIAGNOSIS — Z1231 Encounter for screening mammogram for malignant neoplasm of breast: Secondary | ICD-10-CM

## 2018-09-03 DIAGNOSIS — K862 Cyst of pancreas: Secondary | ICD-10-CM

## 2018-09-03 DIAGNOSIS — R9389 Abnormal findings on diagnostic imaging of other specified body structures: Secondary | ICD-10-CM

## 2018-09-03 NOTE — Telephone Encounter (Signed)
Message  Received: Yesterday  Message Contents  Ladene Artist, MD  Marzella Schlein, CMA        Please call to inform her. She lives in [place] now and she indicated that she was looking at changing to a GI physician in the [place] area. If is still looking please recommend the GI physicians at [place] group. If she prefers to have her MRI here that is fine too.   Previous Messages    ----- Message -----  From: Marzella Schlein, CMA  Sent: 09/02/2018  To: Ladene Artist, MD   Dr. Rozann Lesches needs a follow up MRI pancreatic protocol in 08/2018 for large hemangioma and liver lesions.

## 2018-09-03 NOTE — Telephone Encounter (Signed)
Left a message for patient to return my call. 

## 2018-09-03 NOTE — Telephone Encounter (Signed)
Pt return call best number is the [phone] to call back

## 2018-09-03 NOTE — Telephone Encounter (Signed)
Patient states she would like to schedule with our office. Informed patient I will contact [facility] and have her MRI scheduled. [facility] states they will contact patient directly to schedule.

## 2018-09-10 ENCOUNTER — Telehealth: Payer: Self-pay | Admitting: Emergency Medicine

## 2018-09-10 NOTE — Telephone Encounter (Signed)
-----   Message from Megan Salon, MD sent at 09/05/2018  9:17 PM EST ----- Please let pt know the ECC showed benign endocervical glands and grade 1 cervical dysplasia.  Needs repeat pap and HR HPV 1 year.  08 recall.

## 2018-09-10 NOTE — Telephone Encounter (Signed)
08 Recall entered.  Annual exam scheduled with Dr. Sabra Heck already for 07/22/2019.   Patient notified of results.  Verbalized understanding.  Aware she has annual exam with Dr. Sabra Heck already scheduled.   Wants Dr. Sabra Heck to know that she mailed in cologuard and to expect results.   Routing to provider and will close encounter.

## 2018-09-12 LAB — COLOGUARD: Cologuard: NEGATIVE

## 2018-09-19 ENCOUNTER — Encounter: Payer: Self-pay | Admitting: Obstetrics & Gynecology

## 2018-09-29 ENCOUNTER — Encounter: Payer: Self-pay | Admitting: Obstetrics & Gynecology

## 2018-09-30 ENCOUNTER — Telehealth: Payer: Self-pay | Admitting: *Deleted

## 2018-09-30 DIAGNOSIS — Z1211 Encounter for screening for malignant neoplasm of colon: Secondary | ICD-10-CM

## 2018-09-30 NOTE — Telephone Encounter (Signed)
Dr. Rozann Lesches,  Your Cologuard result was negative. That will be scanned into your chart by medical records, so you should get an e-mail when that has been uploaded to your chart.   For your Colposcopy pathology results, you should be able to view them under the Health tab, then test results, then it may be under "surgical pathology" and when you open that up, you should have a hyperlink that is blue and be able to click on that link and open up the PDF file of the pathology results.   I hope this helps! If you still can't find the results, I would suggest follow up with the Mychart Help desk at [phone].   Sincerely,  Edrick Kins, BSN RN-BC  Triage Nurse  [facility]

## 2018-09-30 NOTE — Telephone Encounter (Signed)
Responded to patient via mychart.  Encounter closed.

## 2018-09-30 NOTE — Telephone Encounter (Signed)
i would like to read the cologuard report where would that be found?  Also the last colposcopy report is not showing as well? Just that the test was done but when I click on it it has no attachment>  Thank you  Marijean

## 2018-10-01 ENCOUNTER — Ambulatory Visit
Admission: RE | Admit: 2018-10-01 | Discharge: 2018-10-01 | Disposition: A | Discharge status: Home / Self Care | Payer: BLUE CROSS/BLUE SHIELD | Source: Ambulatory Visit | Attending: Gastroenterology | Admitting: Gastroenterology

## 2018-10-01 ENCOUNTER — Ambulatory Visit
Admission: RE | Admit: 2018-10-01 | Discharge: 2018-10-01 | Disposition: A | Discharge status: Home / Self Care | Payer: BLUE CROSS/BLUE SHIELD | Source: Ambulatory Visit | Attending: Obstetrics & Gynecology | Admitting: Obstetrics & Gynecology

## 2018-10-01 DIAGNOSIS — R9389 Abnormal findings on diagnostic imaging of other specified body structures: Secondary | ICD-10-CM

## 2018-10-01 DIAGNOSIS — Z1231 Encounter for screening mammogram for malignant neoplasm of breast: Secondary | ICD-10-CM

## 2018-10-01 DIAGNOSIS — K862 Cyst of pancreas: Secondary | ICD-10-CM

## 2018-10-01 DIAGNOSIS — D1803 Hemangioma of intra-abdominal structures: Secondary | ICD-10-CM

## 2018-10-01 MED ORDER — GADOBENATE DIMEGLUMINE 529 MG/ML IV SOLN
12.0000 mL | Freq: Once | INTRAVENOUS | Status: AC | PRN
  Administered 2018-10-01: 12 mL via INTRAVENOUS

## 2018-11-06 NOTE — Addendum Note (Signed)
Addended by: Polly Cobia on: 11/06/2018 01:24 PM   Modules accepted: Orders

## 2019-01-15 ENCOUNTER — Encounter: Payer: Self-pay | Admitting: Obstetrics & Gynecology

## 2019-01-15 ENCOUNTER — Telehealth: Payer: Self-pay | Admitting: Obstetrics & Gynecology

## 2019-01-15 NOTE — Telephone Encounter (Signed)
Left message to call Roselee Tayloe, RN at [facility] [phone].   

## 2019-01-15 NOTE — Telephone Encounter (Signed)
Patient sent the following correspondence through [facility]. Routing to triage to assist patient with request.  Dr. Sabra Heck  I had my mammogram in Feb which was normal. The past month I have had some discomfort in my right lateral breast area. It comes and goes and I have palpated the area and no obvious specific tenderness or lump can be identified. I do not want to be remiss with my sisters past history. What would you suggest as a next step?  If you feel a visit is in order I would like to try and come in around 7/1 or 7/2  Hope all is well with you and your family.  Sandra Mcgrath

## 2019-01-17 NOTE — Telephone Encounter (Signed)
Spoke with patient. Patient reports intermittent lateral right breast pain for 1 month. Denies lumps, sharp pain, skin changes, nipple d/c, fever/chills. Family Hx of breast cancer. Asking what next steps are?   Advised OV needed for further evaluation, Dr. Sabra Heck will then determine if Dx imaging is needed, will assist with scheduling, if needed. Patient request OV on 6/25 late afternoon. OV scheduled for 6/25 at 4:30pm with Dr. Sabra Heck. [id number] precautions reviewed.   Routing to provider for final review. Patient is agreeable to disposition. Will close encounter.

## 2019-01-20 ENCOUNTER — Telehealth: Payer: Self-pay | Admitting: *Deleted

## 2019-01-20 NOTE — Telephone Encounter (Signed)
Left voice mail to call back 

## 2019-01-21 ENCOUNTER — Other Ambulatory Visit: Payer: Self-pay | Admitting: Obstetrics & Gynecology

## 2019-01-21 DIAGNOSIS — N644 Mastodynia: Secondary | ICD-10-CM

## 2019-01-21 NOTE — Telephone Encounter (Signed)
Patient called back. States she is traveling and the only time she is available is Thursday @3 :30pm.    Sandra Mcgrath to schedule appt.   Dub Mikes will call back to confirm appt for patient for Thursday 6/25 @3 :40pm.

## 2019-01-21 NOTE — Telephone Encounter (Signed)
Appointment confirmed. Thursday 6/25 @3 :30pm  Called patient. Left voicemail with appt information.   Dr. Lestine Box  Encounter closed.

## 2019-01-21 NOTE — Telephone Encounter (Signed)
Left voicemail to call back.  Please route to triage if I am not available.

## 2019-01-23 ENCOUNTER — Ambulatory Visit: Payer: BC Managed Care – PPO | Admitting: Obstetrics & Gynecology

## 2019-01-23 ENCOUNTER — Ambulatory Visit
Admission: RE | Admit: 2019-01-23 | Discharge: 2019-01-23 | Disposition: A | Discharge status: Home / Self Care | Payer: BLUE CROSS/BLUE SHIELD | Source: Ambulatory Visit | Attending: Obstetrics & Gynecology | Admitting: Obstetrics & Gynecology

## 2019-01-23 ENCOUNTER — Ambulatory Visit
Admission: RE | Admit: 2019-01-23 | Discharge: 2019-01-23 | Disposition: A | Discharge status: Home / Self Care | Payer: BC Managed Care – PPO | Source: Ambulatory Visit | Attending: Obstetrics & Gynecology | Admitting: Obstetrics & Gynecology

## 2019-01-23 ENCOUNTER — Other Ambulatory Visit: Payer: Self-pay

## 2019-01-23 VITALS — BP 130/90 | HR 62 | Temp 97.9°F | Ht 65.5 in | Wt 138.0 lb

## 2019-01-23 DIAGNOSIS — Z803 Family history of malignant neoplasm of breast: Secondary | ICD-10-CM

## 2019-01-23 DIAGNOSIS — N644 Mastodynia: Secondary | ICD-10-CM | POA: Diagnosis not present

## 2019-01-23 NOTE — Progress Notes (Signed)
GYNECOLOGY  VISIT  CC:   Breast pain   HPI: 53 y.o. G3P1 Married White or Caucasian female here for R breast pain.  Pain has been present for over a month and she thinks it could be due to volume of work she is doing.  She is not completely sure about this.  Pt lives in [place] so was called to see if desires diagnostic imaging since she was coming this far.  She opted to proceed with diagnostic imaging and has just come from [facility].  She reports she was advised imaging was negative.  I cannot see results at this point.  We discussed possible adjuvant imaging with MRI based on lifetime risk assessment.  This was done with her today.  She feels this has been done in the past as well when she went for genetic testing.  I do not have copy of this in EMR so will request paper chart to see if this is present.  Limited breast MRI vs diagnostic breast MRI discussed.  Will wait to see results from genetic evaluation prior to proceeding.  She does have some interest in proceeding with breast MRI, however.  GYNECOLOGIC HISTORY: No LMP recorded. (Menstrual status: IUD). Contraception: IUD  Menopausal hormone therapy: none  Patient Active Problem List   Diagnosis Date Noted  . Family history of breast cancer 07/14/2018  . IUD (intrauterine device) in place 03/26/2015  . Hypertension 04/25/2011  . PREMATURE VENTRICULAR CONTRACTIONS 04/27/2009  . PALPITATIONS 04/27/2009    Past Medical History:  Diagnosis Date  . Hemangioma of liver   . Hyperlipidemia   . Hypertension   . 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 PVC's (premature ventricular contractions)     Past Surgical History:  Procedure Laterality Date  . AUGMENTATION MAMMAPLASTY  07/2012  . CESAREAN SECTION  2/07   35 weeks  . PELVIC LAPAROSCOPY  2006   w/HSG, endometrioma    MEDS:   Current Outpatient Medications on File Prior to Visit  Medication Sig Dispense Refill  . cyanocobalamin (,VITAMIN B-12,)  1000 MCG/ML injection Inject 1,000 mcg into the muscle every 30 (thirty) days.    Marland Kitchen levonorgestrel (MIRENA) 20 MCG/24HR IUD 1 each by Intrauterine route once.      . metoprolol succinate (TOPROL XL) 25 MG 24 hr tablet Take 1 tablet (25 mg total) by mouth daily. 30 tablet 12  . NON FORMULARY Vitamin B with Biotin daily    . VALIUM 10 MG tablet Take 1 tablet (10 mg total) by mouth every 8 (eight) hours as needed for anxiety. 30 tablet 0   No current facility-administered medications on file prior to visit.     ALLERGIES: Patient has no known allergies.  Family History  Problem Relation Age of Onset  . Hypertension Father 83       alive  . Hypertension Mother 44       alive  . Breast cancer Sister 72       genetic testing neg    SH:  Married, non smoker  Review of Systems  All other systems reviewed and are negative.   PHYSICAL EXAMINATION:    BP 130/90   Pulse 62   Temp 97.9 F (36.6 C) (Temporal)   Ht 5' 5.5" (1.664 m)   Wt 138 lb (62.6 kg)   BMI 22.62 kg/m     General appearance: alert, cooperative and appears stated age No exam performed today  Assessment: Breast pain  Family hx of breast cancer in sister diagnosed at age 80 with negative genetic testing  Plan: Awaiting paper chart to review prior genetic counseling visit   ~15 minutes spent with patient >50% of time was in face to face discussion of above.

## 2019-01-24 ENCOUNTER — Encounter: Payer: Self-pay | Admitting: Obstetrics & Gynecology

## 2019-02-03 ENCOUNTER — Encounter: Payer: Self-pay | Admitting: Obstetrics & Gynecology

## 2019-04-14 ENCOUNTER — Telehealth: Payer: Self-pay | Admitting: Internal Medicine

## 2019-04-14 NOTE — Telephone Encounter (Signed)
Patient contacted me regarding a recurrence of her symptoms that she was experiencing at her last visit in 2010.  She is having episodes of a sensation that she cannot get a deep breath at rest but are not produced on exertion and rarely has any nocturnal respiratory symptoms.  They are more likely with speaking or thinking about her breathing especially when she is exposed to hot humid air.  I strongly recommended that she try working out in a cool atmosphere all the way up to maximal exertion and checking her pulse oximetry while she does so and contacting this office if there is any problem with drops in 02 sats otherwise likely she's dealing with the same problem she had in 2010.   I reviewed with her the mechanism for why it is she feels short of breath or unable to take a deep breath if she does not relax back down to [facility], which is the same sensation she had previously.   She is now living in [place] but I offered to see her here right away should she have any symptoms that are exacerbated by exercise or do not resolve with relaxation maneuvers or small doses of valium which she keeps on hand for travel purposes.

## 2019-07-08 ENCOUNTER — Telehealth: Payer: Self-pay | Admitting: Gastroenterology

## 2019-07-08 NOTE — Telephone Encounter (Signed)
Patient reports her aunt was diagnosed with pancreatic cancer and wanted to know if she needed additional testing for any genetic disorder. Previous testing she was BRAC1 negative. Patient reports her aunt in her only relative with pancreatic cancer. No additional testing is recommended. Keep plans for follow up pancreatic protocol MRI in March 2021.

## 2019-07-22 ENCOUNTER — Ambulatory Visit: Payer: BLUE CROSS/BLUE SHIELD | Admitting: Obstetrics & Gynecology

## 2019-08-12 NOTE — Progress Notes (Signed)
54 y.o. G74P1 Married White or Caucasian female here for annual exam.  Doing well.  Had 78 family members that went to [facility] [place].  This was a great trip.  Found a naturopathic provider who gave her and IV right after they landed.    Denies vaginal bleeding except had a little spotting this week.  This lasted for 3 days with a little brown discharge since then.  Mirena placed 05/11/17.    Patient's last menstrual period was 08/11/2019.          Sexually active: Yes.    The current method of family planning is IUD.   Mirena placed 05/11/17 Exercising: Yes.    tennis, walking Smoker:  no  Health Maintenance: Pap:   07-09-18 negative, HR HPV negative, 16/18/45 neg           04/30/17 Neg. HR HPV:+detected. ECC normal             11/20/16 LSIL.   History of abnormal Pap:  yes MMG: 10/01/18- BIRADS 1, Neg, Has implants Colonoscopy: Cologuard 09/08/18- neg BMD:   n/a TDaP:  2009- declined Pneumonia vaccine(s):  none Shingrix:   none Hep C testing: never Screening Labs: PCP- drawn today    reports that she has never smoked. She has never used smokeless tobacco. She reports current alcohol use of about 3.0 - 4.0 standard drinks of alcohol per week. She reports that she does not use drugs.  Past Medical History:  Diagnosis Date  . Hemangioma of liver   . Hyperlipidemia   . Hypertension   . 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 PVC's (premature ventricular contractions)     Past Surgical History:  Procedure Laterality Date  . AUGMENTATION MAMMAPLASTY  07/2012  . CESAREAN SECTION  2/07   35 weeks  . PELVIC LAPAROSCOPY  2006   w/HSG, endometrioma    Current Outpatient Medications  Medication Sig Dispense Refill  . levonorgestrel (MIRENA) 20 MCG/24HR IUD 1 each by Intrauterine route once.      . metoprolol succinate (TOPROL XL) 25 MG 24 hr tablet Take 1 tablet (25 mg total) by mouth daily. 30 tablet 12  . NON FORMULARY Vitamin B with Biotin daily    .  VALIUM 10 MG tablet Take 1 tablet (10 mg total) by mouth every 8 (eight) hours as needed for anxiety. 30 tablet 0   No current facility-administered medications for this visit.    Family History  Problem Relation Age of Onset  . Hypertension Father 31       alive  . Hypertension Mother 31       alive  . Breast cancer Sister 67       genetic testing neg  . Pancreatic cancer Maternal Aunt     Review of Systems  All other systems reviewed and are negative.   Exam:   BP 120/84 (BP Location: Right Arm, Patient Position: Sitting, Cuff Size: Normal)   Pulse 62   Temp 97.9 F (36.6 C) (Skin)   Resp 14   Ht 5\' 5"  (1.651 m)   Wt 131 lb 6.4 oz (59.6 kg)   LMP 08/11/2019   BMI 21.87 kg/m     Height: 5\' 5"  (165.1 cm)  Ht Readings from Last 3 Encounters:  08/15/19 5\' 5"  (1.651 m)  01/23/19 5' 5.5" (1.664 m)  08/30/18 5' 5.5" (1.664 m)    General appearance: alert, cooperative and appears stated age Head:  Normocephalic, without obvious abnormality, atraumatic Neck: no adenopathy, supple, symmetrical, trachea midline and thyroid normal to inspection and palpation Lungs: clear to auscultation bilaterally Breasts: normal appearance, no masses or tenderness Heart: regular rate and rhythm Abdomen: soft, non-tender; bowel sounds normal; no masses,  no organomegaly Extremities: extremities normal, atraumatic, no cyanosis or edema Skin: Skin color, texture, turgor normal. No rashes or lesions Lymph nodes: Cervical, supraclavicular, and axillary nodes normal. No abnormal inguinal nodes palpated Neurologic: Grossly normal   Pelvic: External genitalia:  no lesions              Urethra:  normal appearing urethra with no masses, tenderness or lesions              Bartholins and Skenes: normal                 Vagina: normal appearing vagina with normal color and discharge, no lesions              Cervix: no lesions              Pap taken: Yes.   Bimanual Exam:  Uterus:  normal size,  contour, position, consistency, mobility, non-tender              Adnexa: normal adnexa and no mass, fullness, tenderness               Rectovaginal: Confirms               Anus:  normal sphincter tone, no lesions  Chaperone, Karmen Bongo, RN, was present for exam.  A:  Well Woman with normal exam IUD for BC/cycle control placed 2018 H/o small endometrioma on ultrasound vs scarring from prior surgery.  Last PUS 2017.   Sister with breast cancer, age 36  P:   Mammogram guidelines reviewed.  TCM risk for breast cancer, lifetime risk >10%.  Declines MRI today.  Discussed options with pt last year.  She has decided against any additional testing at this time. pap smear and HR HPV obtained today Cologuard done 08/2018.  Repeat 2023 RF for valium 10mg  1/4 to 1/2 tab prior to flying #30/0RF Lab work done with PCP today Return annually or prn

## 2019-08-15 ENCOUNTER — Other Ambulatory Visit (HOSPITAL_COMMUNITY)
Admission: RE | Admit: 2019-08-15 | Discharge: 2019-08-15 | Disposition: A | Discharge status: Home / Self Care | Payer: Managed Care, Other (non HMO) | Source: Ambulatory Visit | Attending: Obstetrics & Gynecology | Admitting: Obstetrics & Gynecology

## 2019-08-15 ENCOUNTER — Encounter: Payer: Self-pay | Admitting: Obstetrics & Gynecology

## 2019-08-15 ENCOUNTER — Other Ambulatory Visit: Payer: Self-pay

## 2019-08-15 ENCOUNTER — Ambulatory Visit: Payer: Managed Care, Other (non HMO) | Admitting: Obstetrics & Gynecology

## 2019-08-15 VITALS — BP 120/84 | HR 62 | Temp 97.9°F | Resp 14 | Ht 65.0 in | Wt 131.4 lb

## 2019-08-15 DIAGNOSIS — B977 Papillomavirus as the cause of diseases classified elsewhere: Secondary | ICD-10-CM | POA: Diagnosis not present

## 2019-08-15 DIAGNOSIS — Z124 Encounter for screening for malignant neoplasm of cervix: Secondary | ICD-10-CM | POA: Diagnosis not present

## 2019-08-15 DIAGNOSIS — Z01419 Encounter for gynecological examination (general) (routine) without abnormal findings: Secondary | ICD-10-CM | POA: Diagnosis not present

## 2019-08-15 MED ORDER — VALIUM 10 MG PO TABS: 10.0000 mg | ORAL_TABLET | Freq: Three times a day (TID) | ORAL | 0 refills | Status: DC | PRN

## 2019-08-20 LAB — CYTOLOGY - PAP
Comment: NEGATIVE
Diagnosis: UNDETERMINED — AB
High risk HPV: POSITIVE — AB

## 2019-08-24 ENCOUNTER — Encounter: Payer: Self-pay | Admitting: Obstetrics & Gynecology

## 2019-08-25 ENCOUNTER — Encounter: Payer: Self-pay | Admitting: Obstetrics & Gynecology

## 2019-12-02 ENCOUNTER — Telehealth: Payer: Self-pay

## 2019-12-02 DIAGNOSIS — R9389 Abnormal findings on diagnostic imaging of other specified body structures: Secondary | ICD-10-CM

## 2019-12-02 DIAGNOSIS — D1803 Hemangioma of intra-abdominal structures: Secondary | ICD-10-CM

## 2019-12-02 DIAGNOSIS — K862 Cyst of pancreas: Secondary | ICD-10-CM

## 2019-12-02 NOTE — Telephone Encounter (Signed)
-----   Message from Ladene Artist, MD sent at 12/02/2019  8:37 AM EDT ----- The pt is due for a follow up MRI w and wo contrast for follow up of pancreatic cysts and large hepatic hemangioma.  She lives in [place] and wants to coordinate MRI on other days she has appts in [place] if possible. She would like to have it 5/7 Friday morning before 10:30 am or Friday afternoon at 1 or 2 pm. If those times don't work please help her get another date, time. Thanks. [phone]

## 2019-12-02 NOTE — Telephone Encounter (Signed)
Patient would like to have scan scheduled at [facility].  She is notified that they will contact her directly with the appt date and time.

## 2019-12-16 ENCOUNTER — Ambulatory Visit (HOSPITAL_COMMUNITY): Payer: Managed Care, Other (non HMO)

## 2020-01-06 ENCOUNTER — Other Ambulatory Visit: Payer: Managed Care, Other (non HMO)

## 2020-05-24 ENCOUNTER — Telehealth: Payer: Self-pay | Admitting: *Deleted

## 2020-05-24 ENCOUNTER — Encounter: Payer: Self-pay | Admitting: Obstetrics & Gynecology

## 2020-05-24 NOTE — Telephone Encounter (Signed)
Left message to call Sharee Pimple, RN at [facility].    [name] "Dr Rozann Lesches"  P [facility] Dr Sabra Heck  I have noticed some pain and "squeshy" sound on the LR side in the area that I have had an ovarian cyst before.  Is it possible to get in Tuesday or Thursday and have an utrasound and exam done?  Dr Carlynn Purl

## 2020-05-24 NOTE — Telephone Encounter (Signed)
See telephone encounter dated 05/24/20.   Encounter closed.

## 2020-05-25 ENCOUNTER — Encounter: Payer: Self-pay | Admitting: Obstetrics & Gynecology

## 2020-05-25 NOTE — Telephone Encounter (Signed)
Reviewed with Dr. Sabra Heck. Can schedule as work in [place] [employer] w/ consult on 10/28 or schedule PUS in [place], f/u with consult in office if needed.  Spoke with patient. Patient rpeorts pain in RLQ/pelvis that started on Sunday. Currently 1/10 on pain scale. Denies any other GYN symptoms. Reports normal BMs. Denies N/V, fever/chills. Patient request to schedule in [place] today or Thursday. Advised patient I will call to schedule and return call with appt information. Patient agreeable.   Call placed to [facility], spoke with Apolonio Schneiders. Patient scheduled for US pelvis complete with transvaginal for today at 2:30pm, arrive at 2:15pm at [address], [place]. Arrive with full bladder, drink 32 oz of water 1 hour prior. Fax order to [phone].   Call returned to patient, advised of appt as seen above. Patient verbalizes understanding and is agreeable. Patient is aware our office will f/u with results and recommendations once completed.   Routing to Dr. Sabra Heck for final review.

## 2020-05-25 NOTE — Telephone Encounter (Signed)
Patient is returning call.  °

## 2020-05-27 ENCOUNTER — Telehealth: Payer: Self-pay

## 2020-05-27 NOTE — Telephone Encounter (Signed)
Patient is calling for ultrasound results.

## 2020-05-27 NOTE — Telephone Encounter (Signed)
PUS report dated 05/25/20 placed on Dr. Ammie Ferrier desk on 10/27 to review.   Routing to Dr. Sabra Heck to review and advise on f/u

## 2020-05-28 NOTE — Telephone Encounter (Signed)
Patient notified of PUS results by Dr. Sabra Heck.   Removed from IMG hold.   Copy of report to scan.   Encounter closed.

## 2020-06-14 ENCOUNTER — Encounter: Payer: Self-pay | Admitting: Obstetrics & Gynecology

## 2020-06-28 ENCOUNTER — Ambulatory Visit
Admission: RE | Admit: 2020-06-28 | Discharge: 2020-06-28 | Disposition: A | Discharge status: Home / Self Care | Payer: Managed Care, Other (non HMO) | Source: Ambulatory Visit | Attending: Gastroenterology | Admitting: Gastroenterology

## 2020-06-28 ENCOUNTER — Other Ambulatory Visit: Payer: Self-pay | Admitting: Gastroenterology

## 2020-06-28 DIAGNOSIS — R9389 Abnormal findings on diagnostic imaging of other specified body structures: Secondary | ICD-10-CM

## 2020-06-28 DIAGNOSIS — D1803 Hemangioma of intra-abdominal structures: Secondary | ICD-10-CM

## 2020-06-28 DIAGNOSIS — K862 Cyst of pancreas: Secondary | ICD-10-CM

## 2020-06-28 MED ORDER — GADOBENATE DIMEGLUMINE 529 MG/ML IV SOLN
12.0000 mL | Freq: Once | INTRAVENOUS | Status: AC | PRN
  Administered 2020-06-28: 12 mL via INTRAVENOUS

## 2020-08-12 ENCOUNTER — Telehealth (HOSPITAL_COMMUNITY): Payer: Self-pay | Admitting: *Deleted

## 2020-08-12 DIAGNOSIS — Z9189 Other specified personal risk factors, not elsewhere classified: Secondary | ICD-10-CM

## 2020-08-12 DIAGNOSIS — Z136 Encounter for screening for cardiovascular disorders: Secondary | ICD-10-CM

## 2020-08-12 NOTE — Telephone Encounter (Signed)
Per Dr Aundra Dubin pt needs Ca score CT, order placed, will arrange

## 2020-08-17 ENCOUNTER — Ambulatory Visit: Payer: Managed Care, Other (non HMO)

## 2020-08-23 ENCOUNTER — Ambulatory Visit: Payer: Managed Care, Other (non HMO) | Admitting: Obstetrics & Gynecology

## 2020-09-01 ENCOUNTER — Ambulatory Visit: Payer: Managed Care, Other (non HMO) | Admitting: Gastroenterology

## 2020-09-07 ENCOUNTER — Ambulatory Visit: Payer: Managed Care, Other (non HMO) | Admitting: Gastroenterology

## 2020-09-07 VITALS — BP 164/90 | HR 88 | Ht 65.5 in | Wt 132.0 lb

## 2020-09-07 DIAGNOSIS — K862 Cyst of pancreas: Secondary | ICD-10-CM | POA: Diagnosis not present

## 2020-09-07 DIAGNOSIS — R140 Abdominal distension (gaseous): Secondary | ICD-10-CM

## 2020-09-07 DIAGNOSIS — D179 Benign lipomatous neoplasm, unspecified: Secondary | ICD-10-CM

## 2020-09-07 NOTE — Patient Instructions (Signed)
Thank you for choosing me and  Gastroenterology.  Malcolm T. Stark, Jr., MD., FACG  

## 2020-09-07 NOTE — Progress Notes (Signed)
    History of Present Illness: This is a 55 year old female who notes a slighty raised area in her right lower quadrant for the past few months.  For the past few months she has also noticed an infrequent, intermittent a gurgling sensation, slightly bloated feeling in her right lower quadrant.  She had a prior C-section and the incision has been stable for years.  She underwent a cosmetic adipose tissue removing procedure in her lower abdomen in December after the above symptoms were noted with 2 small incisions near the lateral aspects of her C-section incision.  She returned to see the surgeon with concern of a seroma in the right lower quadrant and aspiration attempt was made which apparently did not reveal any fluids that left very slight bruising.  She has had difficulties with constipation in the past however recently her bowel habits have been fairly regular.  No other gastrointestinal complaints. MR abdomen with and without contrast performed on June 28, 2000 for follow-up of a large cavernous hepatic hemangioma and 2 small pancreatic cysts revealed both were stable and no other abnormalities were noted.  Denies weight loss, abdominal pain, constipation, diarrhea, change in stool caliber, melena, hematochezia, nausea, vomiting, dysphagia, reflux symptoms, chest pain.   Current Medications, Allergies, Past Medical History, Past Surgical History, Family History and Social History were reviewed in [employer] record.   Physical Exam: General: Well developed, well nourished, no acute distress Head: Normocephalic and atraumatic Eyes: Sclerae anicteric, EOMI Ears: Normal auditory acuity Mouth: Not examined, mask on during Covid-19 pandemic Lungs: Clear throughout to auscultation Heart: Regular rate and rhythm; no murmurs, rubs or bruits Abdomen: Soft, non tender and non distended. No masses, hepatosplenomegaly or hernias noted. Normal Bowel sounds.  Well-healed scar  consistent with prior C-section history and 2 small incisions, about 1 cm each, at the lateral aspects of her C-section incision. A subtle 3 cm slightly mobile subcutaneous round flat lesion is noted near the right lateral aspect of C-section scar. Rectal: Not done Musculoskeletal: Symmetrical with no gross deformities  Pulses:  Normal pulses noted Extremities: No clubbing, cyanosis, edema or deformities noted Neurological: Alert oriented x 4, grossly nonfocal Psychological:  Alert and cooperative. Normal mood and affect   Assessment and Recommendations:  1.  Suspected RLQ subcutaneous lipoma.  Intermittent abdominal bloating and gurgling is likely benign and unrelated.  She is advised to self examine this area monthly at the time of her self breast exams and if changes are noted she is advised to contact us again.  She is advised to contact us if she has any new GI symptoms.  2.  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  Both are stable, unchanged on MRI.  3.  Two small pancreatic cysts measuring 3 mm and 7 mm respectively have been stable on MRI since February 2019.  Current guidelines recommend repeat imaging annually for 5 years and if the cyst remains stable may discontinue interval imaging.  4. CRC screening, average risk.  Cologuard - February 2020.  Repeat Cologuard or proceed with colonoscopy in February 2023.

## 2020-09-10 ENCOUNTER — Telehealth (HOSPITAL_BASED_OUTPATIENT_CLINIC_OR_DEPARTMENT_OTHER): Payer: Self-pay | Admitting: Obstetrics & Gynecology

## 2020-09-16 ENCOUNTER — Other Ambulatory Visit: Payer: Managed Care, Other (non HMO)

## 2020-09-28 NOTE — Telephone Encounter (Signed)
Patient has been  schedule  with Dr.Miller .

## 2020-10-07 ENCOUNTER — Ambulatory Visit (INDEPENDENT_AMBULATORY_CARE_PROVIDER_SITE_OTHER): Payer: Managed Care, Other (non HMO) | Admitting: Obstetrics & Gynecology

## 2020-10-07 ENCOUNTER — Other Ambulatory Visit (HOSPITAL_COMMUNITY)
Admission: RE | Admit: 2020-10-07 | Discharge: 2020-10-07 | Disposition: A | Discharge status: Home / Self Care | Payer: Managed Care, Other (non HMO) | Source: Ambulatory Visit | Attending: Obstetrics & Gynecology | Admitting: Obstetrics & Gynecology

## 2020-10-07 ENCOUNTER — Encounter (HOSPITAL_BASED_OUTPATIENT_CLINIC_OR_DEPARTMENT_OTHER): Payer: Self-pay | Admitting: Obstetrics & Gynecology

## 2020-10-07 ENCOUNTER — Other Ambulatory Visit: Payer: Self-pay

## 2020-10-07 DIAGNOSIS — Z124 Encounter for screening for malignant neoplasm of cervix: Secondary | ICD-10-CM | POA: Diagnosis present

## 2020-10-07 DIAGNOSIS — N809 Endometriosis, unspecified: Secondary | ICD-10-CM | POA: Diagnosis not present

## 2020-10-07 DIAGNOSIS — Z975 Presence of (intrauterine) contraceptive device: Secondary | ICD-10-CM | POA: Diagnosis not present

## 2020-10-07 DIAGNOSIS — R29898 Other symptoms and signs involving the musculoskeletal system: Secondary | ICD-10-CM

## 2020-10-07 DIAGNOSIS — Z01419 Encounter for gynecological examination (general) (routine) without abnormal findings: Secondary | ICD-10-CM

## 2020-10-07 DIAGNOSIS — B977 Papillomavirus as the cause of diseases classified elsewhere: Secondary | ICD-10-CM

## 2020-10-07 DIAGNOSIS — E349 Endocrine disorder, unspecified: Secondary | ICD-10-CM

## 2020-10-07 DIAGNOSIS — Z803 Family history of malignant neoplasm of breast: Secondary | ICD-10-CM | POA: Diagnosis not present

## 2020-10-07 DIAGNOSIS — Z1159 Encounter for screening for other viral diseases: Secondary | ICD-10-CM

## 2020-10-07 NOTE — Progress Notes (Signed)
55 y.o. Sandra Mcgrath Married White or Caucasian female here for annual exam.  Denies vaginal bleeding.    Her son is in [place].  She is hoping to go visit him in April.  Will have a 3 day quarantine when she goes.  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  Had physical.  Blood work with mildly elevated lipids.  Had MRI of abdomen.    Kiln was elevated on lab work on 08/23/2018.  Having some spotting still.      Has Mirena 05/11/17.  Has noticed some issues with her left ear.  Had a lot of wax in her left ear.  Denies vertigo.  Feels she is leaning at times to the right a little.  She is feeling a little/subtle weakness on the left side.     No LMP recorded. (Menstrual status: IUD).          Sexually active: Yes.    The current method of family planning is IUD.    Exercising: Yes.    Smoker:  no  Health Maintenance: Pap:  Obtained today.  ASCUS with + HR HPV 2021.  07-09-18 negative, HR HPV negative, 16/18/45 neg.  04/30/17 Neg. HR HPV:+detected. ECC normal 11/20/16 LSIL.  History of abnormal Pap:  yes MMG:  Did in [place] last year.  I do not have this.  However was done on 01/23/2020 Colonoscopy: cologuard 09/08/2018 neg BMD:   Not indicated TDaP:  2009, not indicated Pneumonia vaccine(s):  Not indicated Shingrix:   discussed Hep C testing: discussed with pt today Screening Labs: done with PCP   reports that she has never smoked. She has never used smokeless tobacco. She reports current alcohol use of about 3.0 - 4.0 standard drinks of alcohol per week. She reports that she does not use drugs.  Past Medical History:  Diagnosis Date  . Hemangioma of liver   . Hyperlipidemia   . Hypertension   . 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 PVC's (premature ventricular contractions)   . Vitamin D deficiency     Past Surgical History:  Procedure Laterality Date  . AUGMENTATION MAMMAPLASTY  07/2012  . CESAREAN SECTION  2/07   35 weeks  . PELVIC LAPAROSCOPY  2006    w/HSG, endometrioma    Current Outpatient Medications  Medication Sig Dispense Refill  . levonorgestrel (MIRENA) 20 MCG/24HR IUD 1 each by Intrauterine route once.    . metoprolol succinate (TOPROL XL) 25 MG 24 hr tablet Take 1 tablet (25 mg total) by mouth daily. 30 tablet 12  . NON FORMULARY Vitamin B with Biotin daily    . VALIUM 10 MG tablet Take 1 tablet (10 mg total) by mouth every 8 (eight) hours as needed for anxiety. 30 tablet 0   No current facility-administered medications for this visit.    Family History  Problem Relation Age of Onset  . Hypertension Father 8       alive  . Hypertension Mother 60       alive  . Breast cancer Sister 73       genetic testing neg  . Pancreatic cancer Maternal Aunt     Review of Systems  All other systems reviewed and are negative.   Exam:   There were no vitals taken for this visit.     General appearance: alert, cooperative and appears stated age Head: Normocephalic, without obvious abnormality, atraumatic Neck: no adenopathy, supple, symmetrical, trachea midline and thyroid normal  to inspection and palpation Lungs: clear to auscultation bilaterally Breasts: normal appearance, no masses or tenderness Heart: regular rate and rhythm Abdomen: soft, non-tender; bowel sounds normal; no masses,  no organomegaly Extremities: extremities normal, atraumatic, no cyanosis or edema Skin: Skin color, texture, turgor normal. No rashes or lesions Lymph nodes: Cervical, supraclavicular, and axillary nodes normal. No abnormal inguinal nodes palpated Neurologic: Grossly normal  Pelvic: External genitalia:  no lesions              Urethra:  normal appearing urethra with no masses, tenderness or lesions              Bartholins and Skenes: normal                 Vagina: normal appearing vagina with normal color and discharge, no lesions              Cervix: no lesions              Pap taken: Yes.   Bimanual Exam:  Uterus:  normal size,  contour, position, consistency, mobility, non-tender              Adnexa: normal adnexa and no mass, fullness, tenderness               Rectovaginal: Confirms               Anus:  normal sphincter tone, no lesions  Chaperone, Shela Nevin, RN, was present for exam.  Assessment/Plan: 1. Well woman exam with routine gynecological exam - pap and HR HPV obtained today - MMG due 12/2020.  She will plan to do this in [place] this year. - cologuard due next year - screening lab work with PCP in [place] - vaccines reviewed  2. IUD (intrauterine device) in place  3. Family history of breast cancer - pt has intermediate risk for breast cancer.  Abbreviated MRIs have been discussed.  Declines for now.  4. Endometriosis  5. Arm weakness - advised to consider neurology evaluation for peace of mind.  Would have her PCP refer her for this as I am not knowledgeable of any providers in the [place] area with this specialty   6. High risk HPV infection - Cytology - PAP( )  7. Abnormal FSH level - Estradiol; Future - FSH; Future  8. Encounter for hepatitis C screening test for low risk patient - Hepatitis C antibody; Future

## 2020-10-09 ENCOUNTER — Encounter (HOSPITAL_BASED_OUTPATIENT_CLINIC_OR_DEPARTMENT_OTHER): Payer: Self-pay

## 2020-10-11 ENCOUNTER — Other Ambulatory Visit (HOSPITAL_BASED_OUTPATIENT_CLINIC_OR_DEPARTMENT_OTHER): Payer: Self-pay | Admitting: Obstetrics & Gynecology

## 2020-10-11 DIAGNOSIS — Z1231 Encounter for screening mammogram for malignant neoplasm of breast: Secondary | ICD-10-CM

## 2020-10-12 ENCOUNTER — Other Ambulatory Visit: Payer: Self-pay | Admitting: Obstetrics & Gynecology

## 2020-10-13 ENCOUNTER — Encounter (HOSPITAL_BASED_OUTPATIENT_CLINIC_OR_DEPARTMENT_OTHER): Payer: Self-pay

## 2020-10-13 LAB — CYTOLOGY - PAP
Comment: NEGATIVE
Diagnosis: HIGH — AB
High risk HPV: POSITIVE — AB

## 2020-10-18 ENCOUNTER — Encounter (HOSPITAL_BASED_OUTPATIENT_CLINIC_OR_DEPARTMENT_OTHER): Payer: Self-pay

## 2020-10-18 ENCOUNTER — Telehealth: Payer: Self-pay | Admitting: Gastroenterology

## 2020-10-18 NOTE — Telephone Encounter (Signed)
Pt relates intermittent fevers over the weekend that has now resolved. 2 home Covd-19 tests were negative this weekend. Today she notes intermittent crampy epigastric pain. No chest pain, N/VD. In between episodes of pain she feels OK. She is at work now.  Advised clear liquids today then advance as tolerated. Dicyclomine 10 mg po qid prn abdominal pain, #30, no refills. Tylenol ES 1-2 po qid prn fever.  If symptoms persist contact us, see local PCP or to UC/ED for evaluation.

## 2020-10-19 ENCOUNTER — Other Ambulatory Visit (HOSPITAL_BASED_OUTPATIENT_CLINIC_OR_DEPARTMENT_OTHER)
Admission: RE | Admit: 2020-10-19 | Discharge: 2020-10-19 | Disposition: A | Discharge status: Home / Self Care | Payer: Managed Care, Other (non HMO) | Source: Ambulatory Visit | Attending: Obstetrics & Gynecology | Admitting: Obstetrics & Gynecology

## 2020-10-19 ENCOUNTER — Ambulatory Visit (INDEPENDENT_AMBULATORY_CARE_PROVIDER_SITE_OTHER): Payer: Managed Care, Other (non HMO) | Admitting: Obstetrics & Gynecology

## 2020-10-19 ENCOUNTER — Other Ambulatory Visit (HOSPITAL_COMMUNITY)
Admission: RE | Admit: 2020-10-19 | Discharge: 2020-10-19 | Disposition: A | Discharge status: Home / Self Care | Payer: Managed Care, Other (non HMO) | Source: Ambulatory Visit | Attending: Obstetrics & Gynecology | Admitting: Obstetrics & Gynecology

## 2020-10-19 ENCOUNTER — Encounter (HOSPITAL_BASED_OUTPATIENT_CLINIC_OR_DEPARTMENT_OTHER): Payer: Self-pay | Admitting: Obstetrics & Gynecology

## 2020-10-19 ENCOUNTER — Other Ambulatory Visit: Payer: Self-pay

## 2020-10-19 VITALS — BP 150/86 | HR 76 | Wt 132.0 lb

## 2020-10-19 DIAGNOSIS — N870 Mild cervical dysplasia: Secondary | ICD-10-CM

## 2020-10-19 DIAGNOSIS — Z1159 Encounter for screening for other viral diseases: Secondary | ICD-10-CM | POA: Insufficient documentation

## 2020-10-19 DIAGNOSIS — E349 Endocrine disorder, unspecified: Secondary | ICD-10-CM

## 2020-10-19 DIAGNOSIS — R87613 High grade squamous intraepithelial lesion on cytologic smear of cervix (HGSIL): Secondary | ICD-10-CM | POA: Insufficient documentation

## 2020-10-19 DIAGNOSIS — R198 Other specified symptoms and signs involving the digestive system and abdomen: Secondary | ICD-10-CM | POA: Diagnosis not present

## 2020-10-19 LAB — HEPATITIS C ANTIBODY: HCV Ab: NONREACTIVE

## 2020-10-20 ENCOUNTER — Encounter (HOSPITAL_BASED_OUTPATIENT_CLINIC_OR_DEPARTMENT_OTHER): Payer: Self-pay

## 2020-10-20 LAB — ESTRADIOL: Estradiol: 76.2 pg/mL

## 2020-10-20 LAB — FOLLICLE STIMULATING HORMONE: FSH: 25.4 m[IU]/mL

## 2020-10-20 NOTE — Progress Notes (Signed)
55 y.o. Married female here for colposcopy with possible biopsies and/or ECC due to HGSIL Pap with neg HR HPV obtained 10/07/2020.  She has had abnormal pap smears over the past 5-6 years with either LGSIL and or +HR HPV testing present.  This is the first pap smear with HGSIL findings.  However, HR HPV was negative.  Reviewed with pt.  All questions answered prior to proceeding with colposcopy.  Separately, pt concerned about RLQ mass/swelling and rushing liquid like sound that she has noticed.  I did not see/feel any abnormality when she was here on 3/10.  However, she feels it is more noticeable with standing.    No LMP recorded. (Menstrual status: IUD).          Sexually active: Yes.    The current method of family planning is IUD.     Patient has been counseled about results and procedure.  Risks and benefits have bene reviewed including immediate and/or delayed bleeding, infection, cervical scaring from procedure, possibility of needing additional follow up as well as treatment.  rare risks of missing a lesion discussed as well.  All questions answered.  Pt ready to proceed.  BP (!) 150/86   Pulse 76   Wt 132 lb (59.9 kg)   BMI 21.63 kg/m   Physical Exam Exam conducted with a chaperone present.  Abdominal:       Comments: Fullness noted, non tender, above and to right lateral aspect of prior cesarean section, possible hernia.  More noticeable on standing physical exam.  Genitourinary:    General: Normal vulva.     Vagina: Normal.     Cervix: Normal.   Speculum placed.  3% acetic acid applied to cervix for >45 seconds.  Cervix visualized with both 7.5X and 15X magnification.  Green filter also used.  Lugols solution was used.  Findings:  No AWE noted and no abnormal staining with Lugol's noted.  Biopsy:  Not obtained.  ECC:  was performed.  Monsel's was not needed.  Entire vagina was stained with lugol's solution as well and vaginal inspected with low power magnification without  difficulty.  Pt tolerated procedure well and all instruments were removed.   Chaperone, Shela Nevin, RN, was present during procedure.  Assessment/Plan: 1. HGSIL on Pap smear of cervix - Surgical pathology( [facility]/ [facility]) - results and recommendations will be called to pt.  2. RLQ fullness - we discussed CT as most definitve diagnosis for hernia.  She is concerned about the amount of radiation exposure with this.  Will see if standing ultrasound could be done with radiology.

## 2020-10-21 LAB — SURGICAL PATHOLOGY

## 2020-11-06 ENCOUNTER — Encounter (HOSPITAL_BASED_OUTPATIENT_CLINIC_OR_DEPARTMENT_OTHER): Payer: Self-pay

## 2020-11-09 ENCOUNTER — Other Ambulatory Visit (HOSPITAL_BASED_OUTPATIENT_CLINIC_OR_DEPARTMENT_OTHER): Payer: Self-pay | Admitting: Obstetrics & Gynecology

## 2020-11-09 MED ORDER — DIAZEPAM 10 MG PO TABS: ORAL_TABLET | ORAL | 0 refills | Status: DC

## 2020-11-11 ENCOUNTER — Other Ambulatory Visit (HOSPITAL_BASED_OUTPATIENT_CLINIC_OR_DEPARTMENT_OTHER): Payer: Managed Care, Other (non HMO)

## 2020-11-11 ENCOUNTER — Ambulatory Visit (INDEPENDENT_AMBULATORY_CARE_PROVIDER_SITE_OTHER): Payer: Managed Care, Other (non HMO) | Admitting: Obstetrics & Gynecology

## 2020-11-11 ENCOUNTER — Encounter (HOSPITAL_BASED_OUTPATIENT_CLINIC_OR_DEPARTMENT_OTHER): Payer: Self-pay | Admitting: Obstetrics & Gynecology

## 2020-11-11 ENCOUNTER — Ambulatory Visit (HOSPITAL_BASED_OUTPATIENT_CLINIC_OR_DEPARTMENT_OTHER)
Admission: RE | Admit: 2020-11-11 | Discharge: 2020-11-11 | Disposition: A | Discharge status: Home / Self Care | Payer: Managed Care, Other (non HMO) | Source: Ambulatory Visit | Attending: Obstetrics & Gynecology | Admitting: Obstetrics & Gynecology

## 2020-11-11 ENCOUNTER — Other Ambulatory Visit: Payer: Self-pay

## 2020-11-11 ENCOUNTER — Other Ambulatory Visit (HOSPITAL_COMMUNITY)
Admission: RE | Admit: 2020-11-11 | Discharge: 2020-11-11 | Disposition: A | Discharge status: Home / Self Care | Payer: Managed Care, Other (non HMO) | Source: Ambulatory Visit | Attending: Obstetrics & Gynecology | Admitting: Obstetrics & Gynecology

## 2020-11-11 VITALS — Wt 141.8 lb

## 2020-11-11 DIAGNOSIS — N870 Mild cervical dysplasia: Secondary | ICD-10-CM

## 2020-11-11 DIAGNOSIS — N871 Moderate cervical dysplasia: Secondary | ICD-10-CM | POA: Diagnosis not present

## 2020-11-11 DIAGNOSIS — Z1231 Encounter for screening mammogram for malignant neoplasm of breast: Secondary | ICD-10-CM | POA: Insufficient documentation

## 2020-11-11 DIAGNOSIS — R87613 High grade squamous intraepithelial lesion on cytologic smear of cervix (HGSIL): Secondary | ICD-10-CM | POA: Diagnosis not present

## 2020-11-12 ENCOUNTER — Encounter (HOSPITAL_BASED_OUTPATIENT_CLINIC_OR_DEPARTMENT_OTHER): Payer: Self-pay

## 2020-11-13 NOTE — Progress Notes (Signed)
55 y.o. G1P1 Married White Female here for LEEP due to h/o HGSIL pap followed by colposcopy with LGSIL on ECC.  Ectocervix was normal with colposcopy.  Over the last several years, pap smears have shown ASCUS and LGSIL findings as well as HR HPV.  With recent HGSIL pap, feel excision procedure is appropriate at this time.  Pt and I have discussed this as well prior to procedure today reviewing the current ASCCP guidelines for HGSIL pap with lesser findings with colposcopy, taking into account length of time abnormalities have been present as well as no future pregnancy plans.  Risks and benefits have been reviewed.   Pt does have IUD and I could not see strings with prior exam.  Bedside ultrasound performed today showing IUD in correct location but string noted higher in cervix.    No LMP recorded. (Menstrual status: IUD).          Sexually active: Yes.    The current method of family planning is IUD.     Pre-procedure vitals: Weight 141 lb 12.8 oz (64.3 kg). BP:  158/95  P: 782  Procedure explained and patient's questions were invited and answered.   Consent form signed.  Procedure Set-up: Grounding pad located left thigh.  Cautery settings: 45 cut/45 coagulation.  Suction applied to coated speculum.  Procedure:  Speculum placed with good visualization of the cervix.  Colposcopy performed showing:  no visible lesions.  Cervix anesthetized using 2% Xylocaine with 1:100,000units Epinephrine.  6 cc's used.  Entire transition zone excised with 10 x 12 loop in 2 passes.  Second pass was deeper.  Specimen(s) placed on cork and labeled for pathology.  Hemostasis obtained with ball cautery and Monsel's solution.  EBL:  Minimal  Complications:  none  Patient tolerated procedure well and left the office in satisfactory condition.  Post procedure BP stable.  Assessment/Plan: 1. HGSIL on Pap smear of cervix - Surgical pathology( [place]/ [facility]) - Pathology will be called to pt and follow up  pap smear timing will be made at that time - post procedure instructions reviewed  2. Dysplasia of cervix, low grade (CIN 1)

## 2020-11-15 LAB — SURGICAL PATHOLOGY

## 2020-11-16 ENCOUNTER — Encounter (HOSPITAL_BASED_OUTPATIENT_CLINIC_OR_DEPARTMENT_OTHER): Payer: Self-pay

## 2020-11-16 ENCOUNTER — Telehealth (HOSPITAL_BASED_OUTPATIENT_CLINIC_OR_DEPARTMENT_OTHER): Payer: Self-pay | Admitting: Obstetrics & Gynecology

## 2020-11-16 NOTE — Telephone Encounter (Signed)
Pt send my chart message regarding bright red spotting that started today.  She is having mild cramping.  This is just on a mini pad.  She has no clotting.  She is going monitor and knows will need to be seen if this picks up.  Could be menstrual bleeding as well and not just from conization.  They are leaving for the [place] next week so advised to keep a close eye on this and let me know if bleeding increases. Voices clear understanding.

## 2020-11-18 ENCOUNTER — Other Ambulatory Visit: Payer: Self-pay

## 2020-11-18 ENCOUNTER — Encounter (HOSPITAL_BASED_OUTPATIENT_CLINIC_OR_DEPARTMENT_OTHER): Payer: Self-pay | Admitting: Obstetrics & Gynecology

## 2020-11-18 ENCOUNTER — Ambulatory Visit (INDEPENDENT_AMBULATORY_CARE_PROVIDER_SITE_OTHER): Payer: Managed Care, Other (non HMO) | Admitting: Obstetrics & Gynecology

## 2020-11-18 VITALS — BP 138/86 | HR 70 | Wt 141.0 lb

## 2020-11-18 DIAGNOSIS — Z9889 Other specified postprocedural states: Secondary | ICD-10-CM

## 2020-11-18 DIAGNOSIS — N871 Moderate cervical dysplasia: Secondary | ICD-10-CM

## 2020-11-18 DIAGNOSIS — N939 Abnormal uterine and vaginal bleeding, unspecified: Secondary | ICD-10-CM

## 2020-11-18 NOTE — Progress Notes (Signed)
GYNECOLOGY  VISIT  CC:   Post procedure bleeding  HPI: 55 y.o. G3P0021 Married White or Caucasian female here for bleeding that started three days ago.  Pt underwent LEEP 4/14.  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  She has not been doing heavy lifting or any other activity that was discouraged during this post procedure time.  It is not heavy.  Denies cramping.    GYNECOLOGIC HISTORY: No LMP recorded. (Menstrual status: IUD).  Patient Active Problem List   Diagnosis Date Noted  . Family history of breast cancer 07/14/2018  . IUD (intrauterine device) in place 03/26/2015  . PREMATURE VENTRICULAR CONTRACTIONS 04/27/2009  . PALPITATIONS 04/27/2009    Past Medical History:  Diagnosis Date  . Hemangioma of liver   . Hyperlipidemia   . Hypertension   . 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 PVC's (premature ventricular contractions)   . Vitamin D deficiency     Past Surgical History:  Procedure Laterality Date  . AUGMENTATION MAMMAPLASTY  07/2012  . CESAREAN SECTION  2/07   35 weeks  . PELVIC LAPAROSCOPY  2006   w/HSG, endometrioma    MEDS:   Current Outpatient Medications on File Prior to Visit  Medication Sig Dispense Refill  . diazepam (VALIUM) 10 MG tablet Take 1/2 tab prior to airplane flight. 20 tablet 0  . levonorgestrel (MIRENA) 20 MCG/24HR IUD 1 each by Intrauterine route once.    . metoprolol succinate (TOPROL XL) 25 MG 24 hr tablet Take 1 tablet (25 mg total) by mouth daily. 30 tablet 12  . NON FORMULARY Vitamin B with Biotin daily     No current facility-administered medications on file prior to visit.    ALLERGIES: Patient has no known allergies.  Family History  Problem Relation Age of Onset  . Hypertension Father 12       alive  . Hypertension Mother 104       alive  . Breast cancer Sister 41       genetic testing neg  . Pancreatic cancer Maternal Aunt     SH:  Married, non smoker  Review of Systems  Constitutional:  Negative.   Genitourinary: Positive for vaginal bleeding.    PHYSICAL EXAMINATION:    BP 138/86   Pulse 70   Wt 141 lb (64 kg)   BMI 23.11 kg/m     General appearance: alert, cooperative and appears stated age  Pelvic: External genitalia:  no lesions              Urethra:  normal appearing urethra with no masses, tenderness or lesions              Bartholins and Skenes: normal                 Vagina: normal appearing vagina with normal color and discharge, no lesions              Cervix: cone bed with small are of bleeding that is deep, small ooze, monsels applied with excellent hemostatis present             Pt reexamined after 20 minutes and no active bleeding was seen.  Chaperone, Zoila Shutter, RN, was present for exam.  Assessment/Plan: 1. H/O LEEP - pt will continue to monitor and stay in touch with me.  She is leaving for the [place] on Tuesday next week.  2. Vaginal bleeding

## 2020-11-19 ENCOUNTER — Encounter (HOSPITAL_BASED_OUTPATIENT_CLINIC_OR_DEPARTMENT_OTHER): Payer: Self-pay

## 2020-11-20 ENCOUNTER — Telehealth: Payer: Self-pay | Admitting: Obstetrics and Gynecology

## 2020-11-20 ENCOUNTER — Encounter: Payer: Self-pay | Admitting: Obstetrics and Gynecology

## 2020-11-20 NOTE — Telephone Encounter (Signed)
Patient called stating that she had a LEEP last week and then last Thursday was seen in the office for vaginal bleeding and was treated with monsels. Last night had some light spotting, and early this morning passed something dark, grainy. Then after with urinating passed a larger clot. She checked while on the phone and had only some light spotting and some small pieces of red appearing tissue. I advised that if she continues to have light spotting then she can observe and be seen Monday. She will notify if the bleeding increases or she begins passing clots again.   Jaquita Folds, MD

## 2020-11-22 ENCOUNTER — Encounter (HOSPITAL_BASED_OUTPATIENT_CLINIC_OR_DEPARTMENT_OTHER): Payer: Self-pay

## 2020-11-22 ENCOUNTER — Ambulatory Visit (HOSPITAL_BASED_OUTPATIENT_CLINIC_OR_DEPARTMENT_OTHER): Payer: Self-pay | Admitting: Obstetrics & Gynecology

## 2021-02-04 DIAGNOSIS — K439 Ventral hernia without obstruction or gangrene: Secondary | ICD-10-CM | POA: Insufficient documentation

## 2021-02-21 ENCOUNTER — Telehealth: Payer: Managed Care, Other (non HMO) | Admitting: Physician Assistant

## 2021-02-21 ENCOUNTER — Encounter: Payer: Self-pay | Admitting: Physician Assistant

## 2021-02-21 DIAGNOSIS — U071 COVID-19: Secondary | ICD-10-CM

## 2021-02-21 MED ORDER — NIRMATRELVIR/RITONAVIR (PAXLOVID)TABLET: 3.0000 | ORAL_TABLET | Freq: Two times a day (BID) | ORAL | 0 refills | Status: AC

## 2021-02-21 NOTE — Progress Notes (Signed)
Virtual Visit Consent   Sandra Mcgrath, you are scheduled for a virtual visit with a [facility] provider today.     Just as with appointments in the office, your consent must be obtained to participate.  Your consent will be active for this visit and any virtual visit you may have with one of our providers in the next 365 days.     If you have a MyChart account, a copy of this consent can be sent to you electronically.  All virtual visits are billed to your insurance company just like a traditional visit in the office.    As this is a virtual visit, video technology does not allow for your provider to perform a traditional examination.  This may limit your provider's ability to fully assess your condition.  If your provider identifies any concerns that need to be evaluated in person or the need to arrange testing (such as labs, EKG, etc.), we will make arrangements to do so.     Although advances in technology are sophisticated, we cannot ensure that it will always work on either your end or our end.  If the connection with a video visit is poor, the visit may have to be switched to a telephone visit.  With either a video or telephone visit, we are not always able to ensure that we have a secure connection.     I need to obtain your verbal consent now.   Are you willing to proceed with your visit today?    Sandra Mcgrath has provided verbal consent on 02/21/2021 for a virtual visit (video or telephone).   Mar Daring, PA-C   Date: 02/21/2021 8:21 AM   Virtual Visit via Video Note   I, Mar Daring, connected with  Sandra Mcgrath  ([id number], 1966/07/21) on 02/21/21 at  8:15 AM EDT by a video-enabled telemedicine application and verified that I am speaking with the correct person using two identifiers.  Location: Patient: Virtual Visit Location Patient: Home Provider: Virtual Visit Location Provider: Home Office   I discussed the limitations  of evaluation and management by telemedicine and the availability of in person appointments. The patient expressed understanding and agreed to proceed.    Interactive audio and video communications were attempted, although failed due to patient's inability to connect to video. Continued visit with audio only interaction with patient agreement.  History of Present Illness: Sandra Mcgrath is a 55 y.o. who identifies as a female who was assigned female at birth, and is being seen today for Covid 26.  HPI: URI  This is a new problem. The current episode started yesterday (tested positive this morning for Covid 19, sore throat started last night). The problem has been unchanged. There has been no fever. Associated symptoms include congestion, headaches, sinus pain and a sore throat. Treatments tried: Mucinex, pepcid. The treatment provided mild relief.     Problems:  Patient Active Problem List   Diagnosis Date Noted   H/O LEEP 11/18/2020   Dysplasia of cervix, high grade CIN 2 11/18/2020   Family history of breast cancer 07/14/2018   IUD (intrauterine device) in place 03/26/2015   PREMATURE VENTRICULAR CONTRACTIONS 04/27/2009   PALPITATIONS 04/27/2009    Allergies: No Known Allergies Medications:  Current Outpatient Medications:    nirmatrelvir/ritonavir EUA (PAXLOVID) TABS, Take 3 tablets by mouth 2 (two) times daily for 5 days. (Take nirmatrelvir 150 mg two tablets twice daily for 5 days and ritonavir 100 mg one  tablet twice daily for 5 days) Patient GFR is 104, Disp: 30 tablet, Rfl: 0   diazepam (VALIUM) 10 MG tablet, Take 1/2 tab prior to airplane flight., Disp: 20 tablet, Rfl: 0   levonorgestrel (MIRENA) 20 MCG/24HR IUD, 1 each by Intrauterine route once., Disp: , Rfl:    metoprolol succinate (TOPROL XL) 25 MG 24 hr tablet, Take 1 tablet (25 mg total) by mouth daily., Disp: 30 tablet, Rfl: 12   NON FORMULARY, Vitamin B with Biotin daily, Disp: , Rfl:    Observations/Objective: Patient is well-developed, well-nourished in no acute distress.  Speech is clear and coherent with logical content.  Patient is alert and oriented at baseline.    Assessment and Plan: 1. COVID-19 - nirmatrelvir/ritonavir EUA (PAXLOVID) TABS; Take 3 tablets by mouth 2 (two) times daily for 5 days. (Take nirmatrelvir 150 mg two tablets twice daily for 5 days and ritonavir 100 mg one tablet twice daily for 5 days) Patient GFR is 104  Dispense: 30 tablet; Refill: 0 - Continue OTC symptomatic management of choice - Will send OTC vitamins and supplement information through AVS - Paxlovid prescribed - Patient enrolled in MyChart symptom monitoring - Push fluids - Rest as needed - Referral to Covid treatment team placed, consult appreciated - Discussed return precautions and when to seek in-person evaluation, sent via AVS as well  Follow Up Instructions: I discussed the assessment and treatment plan with the patient. The patient was provided an opportunity to ask questions and all were answered. The patient agreed with the plan and demonstrated an understanding of the instructions.  A copy of instructions were sent to the patient via MyChart.  The patient was advised to call back or seek an in-person evaluation if the symptoms worsen or if the condition fails to improve as anticipated.  Time:  I spent 14 minutes with the patient via telehealth technology discussing the above problems/concerns.    Mar Daring, PA-C

## 2021-02-21 NOTE — Patient Instructions (Signed)
Hello Sandra Mcgrath "Dr Sandra Mcgrath",  You are being placed in the home monitoring program for COVID-19 (commonly known as Coronavirus).  This is because you are suspected to have the virus or are known to have the virus.  If you are unsure which group you fall into call your clinic.    As part of this program, you'll answer a daily questionnaire in the MyChart mobile app. You'll receive a notification through the MyChart app when the questionnaire is available. When you log in to MyChart, you'll see the tasks in your To Do activity.       Clinicians will see any answers that are concerning and take appropriate steps.  If at any point you are having a medical emergency, call 911.  If otherwise concerned call your clinic instead of coming into the clinic or hospital.  To keep from spreading the disease you should: Stay home and limit contact with other people as much as possible.  Wash your hands frequently. Cover your coughs and sneezes with a tissue, and throw used tissues in the trash.   Clean and disinfect frequently touched surfaces and objects.    Take care of yourself by: Staying home Resting Drinking fluids Take fever-reducing medications (Tylenol/Acetaminophen and Ibuprofen)  For more information on the disease go to the Centers for Disease Control and Prevention website     You are being prescribed PAXLOVID for COVID-19 infection.   Please call the pharmacy or go through the drive through vs going inside if you are picking up the mediation yourself to prevent further spread. If prescribed to a [facility] affiliated pharmacy, a pharmacist will bring the medication out to your car.   Medications to hold while taking this treatment: None  *If asked to hold, you can resume them 24 hours after your last dose   ADMINISTRATION INSTRUCTIONS: Take with or without food. Swallow the tablets whole. Don't chew, crush, or break the medications because it might not work as well  For each dose  of the medication, you should be taking 3 tablets together (2 pink oval and 1 white oval) TWICE a day for FIVE days   Finish your full five-day course of Paxlovid even if you feel better before you're done. Stopping this medication too early can make it less effective to prevent severe illness related to [name].    Paxlovid is prescribed for YOU ONLY. Don't share it with others, even if they have similar symptoms as you. This medication might not be right for everyone.  Make sure to take steps to protect yourself and others while you're taking this medication in order to get well soon and to prevent others from getting sick with COVID-19.  Paxlovid (nirmatrelvir / ritonavir) can cause hormonal birth control medications to not work well. If you or your partner is currently taking hormonal birth control, use condoms or other birth control methods to prevent unintended pregnancies.    COMMON SIDE EFFECTS: Altered or bad taste in your mouth  Diarrhea  High blood pressure (1% of people) Muscle aches (1% of people)     If your COVID-19 symptoms get worse, get medical help right away. Call 911 if you experience symptoms such as worsening cough, trouble breathing, chest pain that doesn't go away, confusion, a hard time staying awake, and pale or blue-colored skin. This medication won't prevent all COVID-19 cases from getting worse.  Can take to lessen severity: Vit C '500mg'$  twice daily Quercertin 250-'500mg'$  twice daily Zinc 75-'100mg'$  daily Melatonin 3-6  mg at bedtime Vit D3 1000-2000 IU daily Aspirin 81 mg daily with food Optional: Famotidine '20mg'$  daily Also can add tylenol/ibuprofen as needed for fevers and body aches May add Mucinex or Mucinex DM as needed for cough/congestion  10 Things You Can Do to Manage Your COVID-19 Symptoms at Home If you have possible or confirmed COVID-19 Stay home except to get medical care. Monitor your symptoms carefully. If your symptoms get worse, call your  healthcare provider immediately. Get rest and stay hydrated. If you have a medical appointment, call the healthcare provider ahead of time and tell them that you have or may have COVID-19. For medical emergencies, call 911 and notify the dispatch personnel that you have or may have COVID-19. Cover your cough and sneezes with a tissue or use the inside of your elbow. Wash your hands often with soap and water for at least 20 seconds or clean your hands with an alcohol-based hand sanitizer that contains at least 60% alcohol. As much as possible, stay in a specific room and away from other people in your home. Also, you should use a separate bathroom, if available. If you need to be around other people in or outside of the home, wear a mask. Avoid sharing personal items with other people in your household, like dishes, towels, and bedding. Clean all surfaces that are touched often, like counters, tabletops, and doorknobs. Use household cleaning sprays or wipes according to the label instructions. [email or web address] 02/13/2020 This information is not intended to replace advice given to you by your health care provider. Make sure you discuss any questions you have with your healthcare provider. Document Revised: 09/03/2020 Document Reviewed: 09/03/2020 Elsevier Patient Education  [place].

## 2021-02-23 ENCOUNTER — Telehealth: Payer: Managed Care, Other (non HMO) | Admitting: Physician Assistant

## 2021-02-23 DIAGNOSIS — U071 COVID-19: Secondary | ICD-10-CM | POA: Diagnosis not present

## 2021-02-23 NOTE — Progress Notes (Signed)
Virtual Visit Consent   Sandra Mcgrath, you are scheduled for a virtual visit with a [facility] provider today.     Just as with appointments in the office, your consent must be obtained to participate.  Your consent will be active for this visit and any virtual visit you may have with one of our providers in the next 365 days.     If you have a MyChart account, a copy of this consent can be sent to you electronically.  All virtual visits are billed to your insurance company just like a traditional visit in the office.    As this is a virtual visit, video technology does not allow for your provider to perform a traditional examination.  This may limit your provider's ability to fully assess your condition.  If your provider identifies any concerns that need to be evaluated in person or the need to arrange testing (such as labs, EKG, etc.), we will make arrangements to do so.     Although advances in technology are sophisticated, we cannot ensure that it will always work on either your end or our end.  If the connection with a video visit is poor, the visit may have to be switched to a telephone visit.  With either a video or telephone visit, we are not always able to ensure that we have a secure connection.     I need to obtain your verbal consent now.   Are you willing to proceed with your visit today?    Sandra Mcgrath has provided verbal consent on 02/23/2021 for a virtual visit (video or telephone).   Leeanne Rio, [place]   Date: 02/23/2021 5:46 PM   Virtual Visit via Video Note   I, Leeanne Rio, connected with  Sandra Mcgrath  ([id number], [birth year]) on 02/23/21 at  5:45 PM EDT by a video-enabled telemedicine application and verified that I am speaking with the correct person using two identifiers.  Location: Patient: Virtual Visit Location Patient: Home Provider: Virtual Visit Location Provider: Home Office   I discussed the limitations  of evaluation and management by telemedicine and the availability of in person appointments. The patient expressed understanding and agreed to proceed.    History of Present Illness: Sandra Mcgrath is a 55 y.o. who identifies as a female who was assigned female at birth, and is being seen today for potential side effect of antiviral medication Paxlovid that she was prescribed for COVID-19. Patient endorses taking medication as directed, having gotten 3 doses of medicine in her system. Has been feeling better over the past couple of days, only with mild congestion remaining from original symptoms. Went out for a walk today and then checked her BP, noting it was high at 146/101. Thought this was related to activity and heat so she rested and hydrated. She rechecked BP later in the day and noted still elevated at 133/95. Notes she does not have issue with her BP and the diastolic is quite high for her. Did not headache with BP elevation. No other symptoms.   HPI: HPI  Problems:  Patient Active Problem List   Diagnosis Date Noted   Abdominal wall hernia 02/04/2021   H/O LEEP 11/18/2020   Dysplasia of cervix, high grade CIN 2 11/18/2020   Family history of breast cancer 07/14/2018   IUD (intrauterine device) in place 03/26/2015   PREMATURE VENTRICULAR CONTRACTIONS 04/27/2009   PALPITATIONS 04/27/2009    Allergies: No Known Allergies Medications:  Current  Outpatient Medications:    diazepam (VALIUM) 10 MG tablet, Take 1/2 tab prior to airplane flight., Disp: 20 tablet, Rfl: 0   levonorgestrel (MIRENA) 20 MCG/24HR IUD, 1 each by Intrauterine route once., Disp: , Rfl:    metoprolol succinate (TOPROL XL) 25 MG 24 hr tablet, Take 1 tablet (25 mg total) by mouth daily., Disp: 30 tablet, Rfl: 12   nirmatrelvir/ritonavir EUA (PAXLOVID) TABS, Take 3 tablets by mouth 2 (two) times daily for 5 days. (Take nirmatrelvir 150 mg two tablets twice daily for 5 days and ritonavir 100 mg one tablet twice  daily for 5 days) Patient GFR is 104, Disp: 30 tablet, Rfl: 0   NON FORMULARY, Vitamin B with Biotin daily, Disp: , Rfl:   Observations/Objective: Patient is well-developed, well-nourished in no acute distress.  Resting comfortably at home.  Head is normocephalic, atraumatic.  No labored breathing. Speech is clear and coherent with logical content.  Patient is alert and oriented at baseline.   Assessment and Plan: 1. COVID-19 Mild symptoms. Already significantly improved. Only just started antiviral. Discussed that per literature there is ~1% chance of Paxlovid causing hypertensive BP readings but the possibility is there. Discussed with her having low risks of complicated infection, mild symptoms and improvement in those symptoms, she may want to forego the antiviral as there is still a lot we do not know about this medication. Discussed elevated BP could be isolated and secondary to COVID and her walking in the heat. She is to recheck BP tomorrow to make sure returning to normal. Discussed avoidance of decongestants as well (she has not been taking).   Follow Up Instructions: I discussed the assessment and treatment plan with the patient. The patient was provided an opportunity to ask questions and all were answered. The patient agreed with the plan and demonstrated an understanding of the instructions.  A copy of instructions were sent to the patient via MyChart.  The patient was advised to call back or seek an in-person evaluation if the symptoms worsen or if the condition fails to improve as anticipated.  Time:  I spent 15 minutes with the patient via telehealth technology discussing the above problems/concerns.    Leeanne Rio, PA-C

## 2021-03-10 ENCOUNTER — Telehealth: Payer: Self-pay

## 2021-03-10 NOTE — Telephone Encounter (Signed)
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   She wants to have evaluation for potential surgery in [place]. I recommended Dr. Ralene Ok at [facility]. She called last week and again this week, said referred by me, and still has not received a call back to set up an appt. Would you please contact [facility] to be sure they have our referral to Dr. Rosendo Gros and ask if they would call the patient this week. Thanks.

## 2021-03-10 NOTE — Telephone Encounter (Signed)
Patient notified that new referral sent and she will be contacted directly by [facility] with an appointment date and time.

## 2021-04-20 ENCOUNTER — Telehealth (HOSPITAL_BASED_OUTPATIENT_CLINIC_OR_DEPARTMENT_OTHER): Payer: Self-pay | Admitting: Obstetrics & Gynecology

## 2021-04-20 ENCOUNTER — Encounter (HOSPITAL_BASED_OUTPATIENT_CLINIC_OR_DEPARTMENT_OTHER): Payer: Self-pay

## 2021-04-20 MED ORDER — SULFAMETHOXAZOLE-TRIMETHOPRIM 800-160 MG PO TABS: 1.0000 | ORAL_TABLET | Freq: Two times a day (BID) | ORAL | 0 refills | Status: AC

## 2021-04-20 NOTE — Telephone Encounter (Signed)
Patient called and would like for someone to call her.

## 2021-05-30 ENCOUNTER — Encounter (HOSPITAL_BASED_OUTPATIENT_CLINIC_OR_DEPARTMENT_OTHER): Payer: Self-pay

## 2021-05-31 NOTE — Telephone Encounter (Signed)
Please advise. tbw

## 2021-07-04 ENCOUNTER — Telehealth: Payer: Self-pay

## 2021-07-04 NOTE — Telephone Encounter (Signed)
-----   Message from Ladene Artist, MD sent at 06/29/2021 12:20 PM EST ----- Per radiology report: Current guidelines recommend repeat imaging annually for 5 years and if the cysts remains stable may discontinue interval imaging.  Her first abd MR was in 2019 so will follow annually at least until 2023.  ----- Message ----- From: Marlon Pel, RN Sent: 06/29/2021   8:35 AM EST To: Ladene Artist, MD  Pleae review. If needed. MRI note says one year.  The office visit from 4/22 says 5 years.   ----- Message ----- From: Marlon Pel, RN Sent: 06/29/2021  12:00 AM EST To: Marlon Pel, RN  Needs MRI in 1 year- [place] Plan

## 2021-07-04 NOTE — Telephone Encounter (Signed)
Left message for patient to call back  

## 2021-07-07 NOTE — Telephone Encounter (Signed)
Left message for patient to call back  

## 2021-07-11 NOTE — Telephone Encounter (Signed)
Received: Today Ladene Artist, MD sent to Marlon Pel, RN This patient contacted me to discuss her abd MRI and its frequency. We reviewed the radiologist recommendation for an abd MRI every year for 5 years to follow the tiny pancreatic cystis. 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 I feel this is reasonable so cancel the abd MRI recall for 2022 and schedule her next abd MRI for 05/2022. Marland Kitchen Please convert this to a phone note.

## 2021-07-11 NOTE — Telephone Encounter (Signed)
Reminder placed for 05/2022

## 2021-07-15 ENCOUNTER — Ambulatory Visit (HOSPITAL_BASED_OUTPATIENT_CLINIC_OR_DEPARTMENT_OTHER): Payer: Managed Care, Other (non HMO) | Admitting: Obstetrics & Gynecology

## 2021-07-19 ENCOUNTER — Other Ambulatory Visit: Payer: Self-pay

## 2021-07-19 ENCOUNTER — Ambulatory Visit (HOSPITAL_BASED_OUTPATIENT_CLINIC_OR_DEPARTMENT_OTHER)
Admission: RE | Admit: 2021-07-19 | Discharge: 2021-07-19 | Disposition: A | Discharge status: Home / Self Care | Payer: Managed Care, Other (non HMO) | Source: Ambulatory Visit | Attending: Obstetrics & Gynecology | Admitting: Obstetrics & Gynecology

## 2021-07-19 ENCOUNTER — Encounter (HOSPITAL_BASED_OUTPATIENT_CLINIC_OR_DEPARTMENT_OTHER): Payer: Self-pay | Admitting: Obstetrics & Gynecology

## 2021-07-19 ENCOUNTER — Ambulatory Visit (HOSPITAL_BASED_OUTPATIENT_CLINIC_OR_DEPARTMENT_OTHER): Payer: Managed Care, Other (non HMO) | Admitting: Obstetrics & Gynecology

## 2021-07-19 ENCOUNTER — Other Ambulatory Visit (HOSPITAL_COMMUNITY)
Admission: RE | Admit: 2021-07-19 | Discharge: 2021-07-19 | Disposition: A | Discharge status: Home / Self Care | Payer: Managed Care, Other (non HMO) | Source: Ambulatory Visit | Attending: Obstetrics & Gynecology | Admitting: Obstetrics & Gynecology

## 2021-07-19 VITALS — BP 148/96 | HR 102 | Ht 66.0 in | Wt 136.4 lb

## 2021-07-19 DIAGNOSIS — Z30433 Encounter for removal and reinsertion of intrauterine contraceptive device: Secondary | ICD-10-CM

## 2021-07-19 DIAGNOSIS — N882 Stricture and stenosis of cervix uteri: Secondary | ICD-10-CM | POA: Diagnosis not present

## 2021-07-19 DIAGNOSIS — N871 Moderate cervical dysplasia: Secondary | ICD-10-CM | POA: Insufficient documentation

## 2021-07-19 DIAGNOSIS — Z30431 Encounter for routine checking of intrauterine contraceptive device: Secondary | ICD-10-CM | POA: Insufficient documentation

## 2021-07-19 DIAGNOSIS — N854 Malposition of uterus: Secondary | ICD-10-CM | POA: Diagnosis not present

## 2021-07-19 MED ORDER — LEVONORGESTREL 20 MCG/DAY IU IUD
1.0000 | INTRAUTERINE_SYSTEM | Freq: Once | INTRAUTERINE | Status: AC
  Administered 2021-07-19: 16:00:00 1 via INTRAUTERINE

## 2021-07-19 NOTE — Progress Notes (Signed)
55 y.o. [id number] Married Caucasian female presents for removal of Mirena and re-insertion of IUD.  Pt does have hx of LEEP and hx of non visualized IUD string.  She is planning on using another Mirena IUD.  Pt desires removal and replacement as she has started having bleeding with cycles again.  IUD has been present a little over 5 years. She understands IUD now has 8 year indication for contraception but this is not the same indication for bleeding.    Pt also needs pap smear done today due to hx of CIN 2 and +HR HPV.  Risks, benefits and consent reviewed/obtained.  All questions answered prior to start of procedure.   LMP:  No LMP recorded. (Menstrual status: IUD).  Patient Active Problem List   Diagnosis Date Noted   Abdominal wall hernia 02/04/2021   H/O LEEP 11/18/2020   Dysplasia of cervix, high grade CIN 2 11/18/2020   Family history of breast cancer 07/14/2018   IUD (intrauterine device) in place 03/26/2015   PREMATURE VENTRICULAR CONTRACTIONS 04/27/2009   PALPITATIONS 04/27/2009   Past Medical History:  Diagnosis Date   Hemangioma of liver    Hyperlipidemia    Hypertension    Juvenile rheumatic fever    no MVP, negative ECHO 12/10   PVC's (premature ventricular contractions)    Spigelian hernia    Vitamin D deficiency    Current Outpatient Medications on File Prior to Visit  Medication Sig Dispense Refill   diazepam (VALIUM) 10 MG tablet Take 1/2 tab prior to airplane flight. 20 tablet 0   levonorgestrel (MIRENA) 20 MCG/24HR IUD 1 each by Intrauterine route once.     metoprolol succinate (TOPROL XL) 25 MG 24 hr tablet Take 1 tablet (25 mg total) by mouth daily. 30 tablet 12   NON FORMULARY Vitamin B with Biotin daily     No current facility-administered medications on file prior to visit.   Patient has no known allergies.  Review of Systems  Constitutional: Negative.   Vitals:   07/19/21 1453  BP: (!) 148/96  Pulse: (!) 102  Weight: 136 lb 6.4 oz (61.9 kg)   Height: 5\' 6"  (1.676 m)    Gen:  WNWF healthy female NAD Abdomen: soft, non-tender Groin:  no inguinal nodes palpated  Pelvic exam: Vulva:  normal female genitalia Vagina:  normal vagina Cervix:  Non-tender, Negative CMT, no lesions or redness. Uterus:  normal shape, position and consistency   Procedure:  Speculum reinserted.  Cervix visualized and cleansed with Betadine x 3.  Paracervical block was not placed.  Single toothed tenaculum applied to anterior lip of cervix without difficulty.  Cervical stenosis present.  Dilation of cervix needed.  Os finder was too large.  Small stainless steel cervical dilators needed.  Three used prior to being able to pass os finder for enough dilation to pass instruments.  Pap was obtained once dilation was completed.  Betadine then reapplied to cervix.  Curette passed and rotated clockwise.  Three passes  iud grasped and removed with one pull.  Then uterus sounded to 7cm.  IUD with introducer then passed through cervix to fundus.  IUD released and introducer removed.  IUD came out with introducer.  This was attempted a second time.  Again the IUD came out with the introducer.  A new IUD was obtained.  This was then passed to the fundus and IUD released.  String cut to 3cm.   Tenaculum removed from cervix.  Minimal bleeding noted.  Pt tolerated  the procedure well.  All instruments removed from vagina.  Due to difficulty with placement and concern that patient does not live locally and is traveling over the holidays, ultrasound confirmation of placement recommended.  Assessment/Plan: 1. Encounter for IUD removal and reinsertion - Placement was difficult today so pt being sent for ultrasound for confirmation of placement  2. Cervical stenosis (uterine cervix)  3. Retroflexion of uterus  4. IUD check up - US PELVIC COMPLETE WITH TRANSVAGINAL; Future  5. Dysplasia of cervix, high grade CIN 2 - Cytology - PAP( [place])  Addendum:  IUD is in lower  uterine segment.  She stayed until results were finalized.  Pt and I discussed keeping and watching as this may ultimately move in uterus to correct placement or remove now.  She decided to have removed.  Speculum placed.  IUD string noted and IUD removed with one pull.  Pt tolerated procedure well.

## 2021-07-26 ENCOUNTER — Encounter (HOSPITAL_BASED_OUTPATIENT_CLINIC_OR_DEPARTMENT_OTHER): Payer: Self-pay | Admitting: Obstetrics & Gynecology

## 2021-07-26 LAB — CYTOLOGY - PAP
Comment: NEGATIVE
Comment: NEGATIVE
Diagnosis: UNDETERMINED — AB
HPV 16: NEGATIVE
HPV 18 / 45: NEGATIVE
High risk HPV: POSITIVE — AB

## 2021-08-06 ENCOUNTER — Encounter (HOSPITAL_BASED_OUTPATIENT_CLINIC_OR_DEPARTMENT_OTHER): Payer: Self-pay | Admitting: Obstetrics & Gynecology

## 2021-08-08 ENCOUNTER — Ambulatory Visit (HOSPITAL_BASED_OUTPATIENT_CLINIC_OR_DEPARTMENT_OTHER): Payer: Managed Care, Other (non HMO) | Admitting: Obstetrics & Gynecology

## 2021-09-14 ENCOUNTER — Other Ambulatory Visit (HOSPITAL_BASED_OUTPATIENT_CLINIC_OR_DEPARTMENT_OTHER): Payer: Managed Care, Other (non HMO)

## 2021-09-14 ENCOUNTER — Ambulatory Visit (HOSPITAL_BASED_OUTPATIENT_CLINIC_OR_DEPARTMENT_OTHER): Payer: Managed Care, Other (non HMO) | Admitting: Obstetrics & Gynecology

## 2021-09-15 ENCOUNTER — Ambulatory Visit (HOSPITAL_BASED_OUTPATIENT_CLINIC_OR_DEPARTMENT_OTHER): Payer: Managed Care, Other (non HMO) | Admitting: Obstetrics & Gynecology

## 2021-09-24 LAB — COLOGUARD: COLOGUARD: NEGATIVE

## 2021-10-08 ENCOUNTER — Encounter: Payer: Self-pay | Admitting: Physician Assistant

## 2021-10-08 ENCOUNTER — Telehealth: Payer: Managed Care, Other (non HMO) | Admitting: Physician Assistant

## 2021-10-08 DIAGNOSIS — I10 Essential (primary) hypertension: Secondary | ICD-10-CM

## 2021-10-08 MED ORDER — HYDROCHLOROTHIAZIDE 12.5 MG PO TABS: 12.5000 mg | ORAL_TABLET | Freq: Every day | ORAL | 1 refills | Status: DC

## 2021-10-08 NOTE — Progress Notes (Signed)
?Virtual Visit Consent  ? ?[name], you are scheduled for a virtual visit with a [facility] provider today.   ?  ?Just as with appointments in the office, your consent must be obtained to participate.  Your consent will be active for this visit and any virtual visit you may have with one of our providers in the next 365 days.   ?  ?If you have a MyChart account, a copy of this consent can be sent to you electronically.  All virtual visits are billed to your insurance company just like a traditional visit in the office.   ? ?As this is a virtual visit, video technology does not allow for your provider to perform a traditional examination.  This may limit your provider's ability to fully assess your condition.  If your provider identifies any concerns that need to be evaluated in person or the need to arrange testing (such as labs, EKG, etc.), we will make arrangements to do so.   ?  ?Although advances in technology are sophisticated, we cannot ensure that it will always work on either your end or our end.  If the connection with a video visit is poor, the visit may have to be switched to a telephone visit.  With either a video or telephone visit, we are not always able to ensure that we have a secure connection.    ? ?I need to obtain your verbal consent now.   Are you willing to proceed with your visit today?  ?  ?Danetta Prom has provided verbal consent on 10/08/2021 for a virtual visit (video or telephone). ?  ?Kennieth Rad, PA-C  ? ?Date: 10/08/2021 10:29 AM ? ? ?Virtual Visit via Video Note  ? ?I, Sandra Mcgrath, connected with  Tauna Macfarlane  ([id number], 07/22/1966) on 10/08/21 at 10:00 AM EST by a video-enabled telemedicine application and verified that I am speaking with the correct person using two identifiers. ? ?Location: ?Patient: Virtual Visit Location Patient: Home ?Provider: Virtual Visit Location Provider: Home Office ?  ?I discussed the limitations of  evaluation and management by telemedicine and the availability of in person appointments. The patient expressed understanding and agreed to proceed.   ? ?History of Present Illness: ?Sandra Mcgrath is a 56 y.o. who identifies as a female who was assigned female at birth, and is being seen today for elevated blood pressure readings.  States that she has been having elevated blood pressure readings for the last week and a half.  States that she has previously been diagnosed as "borderline hypertensive.  States that she does take metoprolol 37 and half milligrams once daily, states that she has been taking that medication for the past 18 years. ? ?States that she recently was seen by gynecology, and had her IUD removed, states the first month without her IUD was unremarkable, states that the second month February 2023, she started having hot flushes and "tingling sensation in her head".  This is what prompted her to start checking her blood pressure..  States that her blood pressure readings have been ranging from 111/72, states that that was last night after relaxing and having a glass of wine, but has had readings of 160/101. ? ?States that she has been having more difficulty sleeping due to the hot flushes, endorses good sleep hygiene.. ? ? ?HPI: HPI  ?Problems:  ?Patient Active Problem List  ? Diagnosis Date Noted  ? Abdominal wall hernia 02/04/2021  ? H/O LEEP  11/18/2020  ? Dysplasia of cervix, high grade CIN 2 11/18/2020  ? Family history of breast cancer 07/14/2018  ? IUD (intrauterine device) in place 03/26/2015  ? PREMATURE VENTRICULAR CONTRACTIONS 04/27/2009  ? PALPITATIONS 04/27/2009  ?  ?Allergies: No Known Allergies ?Medications:  ?Current Outpatient Medications:  ?  hydrochlorothiazide (HYDRODIURIL) 12.5 MG tablet, Take 1 tablet (12.5 mg total) by mouth daily., Disp: 30 tablet, Rfl: 1 ?  diazepam (VALIUM) 10 MG tablet, Take 1/2 tab prior to airplane flight., Disp: 20 tablet, Rfl: 0 ?   levonorgestrel (MIRENA) 20 MCG/24HR IUD, 1 each by Intrauterine route once., Disp: , Rfl:  ?  metoprolol succinate (TOPROL XL) 25 MG 24 hr tablet, Take 1 tablet (25 mg total) by mouth daily., Disp: 30 tablet, Rfl: 12 ?  NON FORMULARY, Vitamin B with Biotin daily, Disp: , Rfl:  ? ?Observations/Objective: ?Patient is well-developed, well-nourished in no acute distress.  ?Resting comfortably  at home.  ?Head is normocephalic, atraumatic.  ?No labored breathing.  ?Speech is clear and coherent with logical content.  ?Patient is alert and oriented at baseline.  ? ? ?Assessment and Plan: ?1. Essential hypertension ?- hydrochlorothiazide (HYDRODIURIL) 12.5 MG tablet; Take 1 tablet (12.5 mg total) by mouth daily.  Dispense: 30 tablet; Refill: 1 ? ?Patient is a Pharmacist, community, patient has full copy of recent lab work that was completed by her primary care provider in February 2023.  Her last GFR was 104. ? ?Reasonable to start low-dose hydrochlorothiazide.  Patient encouraged to continue checking blood pressure on a daily basis, keep a written log and have available for all office visits.  Patient has upcoming appointment in 4 days with gynecology.   ? ? ? ?Follow Up Instructions: ?I discussed the assessment and treatment plan with the patient. The patient was provided an opportunity to ask questions and all were answered. The patient agreed with the plan and demonstrated an understanding of the instructions.  A copy of instructions were sent to the patient via MyChart unless otherwise noted below.  ? ? ? ?The patient was advised to call back or seek an in-person evaluation if the symptoms worsen or if the condition fails to improve as anticipated. ? ?Time:  ?I spent 23 minutes with the patient via telehealth technology discussing the above problems/concerns.   ? ?Park Beck S Mayers, PA-C ? ?

## 2021-10-08 NOTE — Patient Instructions (Addendum)
?  Sandra Mcgrath, thank you for joining Kennieth Rad, PA-C for today's virtual visit.  While this provider is not your primary care provider (PCP), if your PCP is located in our provider database this encounter information will be shared with them immediately following your visit. ? ?Consent: ?(Patient) Sandra Mcgrath provided verbal consent for this virtual visit at the beginning of the encounter. ? ?Current Medications: ? ?Current Outpatient Medications:  ?  hydrochlorothiazide (HYDRODIURIL) 12.5 MG tablet, Take 1 tablet (12.5 mg total) by mouth daily., Disp: 30 tablet, Rfl: 1 ?  diazepam (VALIUM) 10 MG tablet, Take 1/2 tab prior to airplane flight., Disp: 20 tablet, Rfl: 0 ?  levonorgestrel (MIRENA) 20 MCG/24HR IUD, 1 each by Intrauterine route once., Disp: , Rfl:  ?  metoprolol succinate (TOPROL XL) 25 MG 24 hr tablet, Take 1 tablet (25 mg total) by mouth daily., Disp: 30 tablet, Rfl: 12 ?  NON FORMULARY, Vitamin B with Biotin daily, Disp: , Rfl:   ? ?Medications ordered in this encounter:  ?Meds ordered this encounter  ?Medications  ? hydrochlorothiazide (HYDRODIURIL) 12.5 MG tablet  ?  Sig: Take 1 tablet (12.5 mg total) by mouth daily.  ?  Dispense:  30 tablet  ?  Refill:  1  ?  Order Specific Question:   Supervising Provider  ?  Answer:   Noemi Chapel [[id number]]  ?  ? ?*If you need refills on other medications prior to your next appointment, please contact your pharmacy* ? ?Follow-Up: ?Call back or seek an in-person evaluation if the symptoms worsen or if the condition fails to improve as anticipated. ? ?Other Instructions ?Please continue to check your Bp on a daily basis, keep a written log and have available for all office visits.  I encourage you to keep your appointment with gynecology, and to schedule a follow-up appointment with your primary care provider in the next 6 to 8 weeks for follow-up with this new medication. ? ?The product I spoke about is called a ChiliPad , this may be  very helpful for you for your menopausal symptoms. ? ? ?If you have been instructed to have an in-person evaluation today at a local Urgent Care facility, please use the link below. It will take you to a list of all of our available [facility], including address, phone number and hours of operation. Please do not delay care.  ?[facility] ? ?If you or a family member do not have a primary care provider, use the link below to schedule a visit and establish care. When you choose a [facility] primary care physician or advanced practice provider, you gain a long-term partner in health. ?Find a Primary Care Provider ? ?Learn more about [facility] in-office and virtual care options: ?[facility] ?

## 2021-10-11 ENCOUNTER — Ambulatory Visit (HOSPITAL_BASED_OUTPATIENT_CLINIC_OR_DEPARTMENT_OTHER): Payer: Managed Care, Other (non HMO) | Admitting: Obstetrics & Gynecology

## 2021-10-12 ENCOUNTER — Other Ambulatory Visit (HOSPITAL_BASED_OUTPATIENT_CLINIC_OR_DEPARTMENT_OTHER): Payer: Self-pay | Admitting: Obstetrics & Gynecology

## 2021-10-12 ENCOUNTER — Other Ambulatory Visit (HOSPITAL_BASED_OUTPATIENT_CLINIC_OR_DEPARTMENT_OTHER): Payer: Managed Care, Other (non HMO)

## 2021-10-12 DIAGNOSIS — Z1231 Encounter for screening mammogram for malignant neoplasm of breast: Secondary | ICD-10-CM

## 2021-10-13 ENCOUNTER — Other Ambulatory Visit: Payer: Self-pay

## 2021-10-13 ENCOUNTER — Encounter (HOSPITAL_BASED_OUTPATIENT_CLINIC_OR_DEPARTMENT_OTHER): Payer: Self-pay | Admitting: Obstetrics & Gynecology

## 2021-10-13 ENCOUNTER — Other Ambulatory Visit (HOSPITAL_COMMUNITY)
Admission: RE | Admit: 2021-10-13 | Discharge: 2021-10-13 | Disposition: A | Discharge status: Home / Self Care | Payer: Managed Care, Other (non HMO) | Source: Ambulatory Visit | Attending: Obstetrics & Gynecology | Admitting: Obstetrics & Gynecology

## 2021-10-13 ENCOUNTER — Ambulatory Visit (INDEPENDENT_AMBULATORY_CARE_PROVIDER_SITE_OTHER): Payer: Managed Care, Other (non HMO) | Admitting: Obstetrics & Gynecology

## 2021-10-13 VITALS — Ht 65.5 in | Wt 139.2 lb

## 2021-10-13 DIAGNOSIS — F418 Other specified anxiety disorders: Secondary | ICD-10-CM

## 2021-10-13 DIAGNOSIS — N871 Moderate cervical dysplasia: Secondary | ICD-10-CM | POA: Insufficient documentation

## 2021-10-13 DIAGNOSIS — R232 Flushing: Secondary | ICD-10-CM | POA: Diagnosis not present

## 2021-10-13 DIAGNOSIS — Z01419 Encounter for gynecological examination (general) (routine) without abnormal findings: Secondary | ICD-10-CM

## 2021-10-13 DIAGNOSIS — Z9889 Other specified postprocedural states: Secondary | ICD-10-CM | POA: Diagnosis not present

## 2021-10-13 DIAGNOSIS — Z124 Encounter for screening for malignant neoplasm of cervix: Secondary | ICD-10-CM

## 2021-10-13 DIAGNOSIS — Z79899 Other long term (current) drug therapy: Secondary | ICD-10-CM

## 2021-10-13 DIAGNOSIS — Z803 Family history of malignant neoplasm of breast: Secondary | ICD-10-CM

## 2021-10-13 MED ORDER — DIAZEPAM 10 MG PO TABS: ORAL_TABLET | ORAL | 0 refills | Status: DC

## 2021-10-13 NOTE — Progress Notes (Signed)
56 y.o. [id number] Married White or Caucasian female here for annual exam.  H/o CIN 2 with LEEP 10/2020.  Margins were negative.  Follow up pap showed ASCUS with +HR HPV.   Does need repeat pap and HR HPV today.  Prior pap (before LEEP) was HGSIL. ? ?Had IUD removal and attempt at replacing IUD.  U/s showed this was not in proper location so was removed.  Since that time, she's been having hot flashes, some associated tachycardia and issues with elevated BPs.  Did virtual visit and is on HCTZ.  Just last night, started having leg cramps.  Would like potassium level checked. ? ?She's has two episodes of spotting since the IUD was removed.  Reports there is actually less spotting since the IUD has been removed.   ? ?Does not want to be on estrogen due to family history of breast cancer. ? ?Does need valium refill.  Uses for flying as has situation anxiety with this.  Uses very carefully. ? ?Health Maintenance: ?Pap:  07/19/2021 ASC-US, Positive HPV ?History of abnormal Pap:  LEEP ?MMG:  11/11/2020 ?Colonoscopy:  08/30/2018 ?Screening Labs: potassium obtained today ? ? reports that she has never smoked. She has never used smokeless tobacco. She reports current alcohol use of about 3.0 - 4.0 standard drinks per week. She reports that she does not use drugs. ? ?Past Medical History:  ?Diagnosis Date  ? Hemangioma of liver   ? Hyperlipidemia   ? Hypertension   ? Juvenile rheumatic fever   ? no MVP, negative ECHO 12/10  ? PVC's (premature ventricular contractions)   ? Spigelian hernia   ? Vitamin D deficiency   ? ? ?Past Surgical History:  ?Procedure Laterality Date  ? AUGMENTATION MAMMAPLASTY  07/2012  ? CESAREAN SECTION  2/07  ? 35 weeks  ? PELVIC LAPAROSCOPY  2006  ? w/HSG, endometrioma  ? ? ?Current Outpatient Medications  ?Medication Sig Dispense Refill  ? diazepam (VALIUM) 10 MG tablet Take 1/2 tab prior to airplane flight. 20 tablet 0  ? hydrochlorothiazide (HYDRODIURIL) 12.5 MG tablet Take 1 tablet (12.5 mg total) by  mouth daily. 30 tablet 1  ? metoprolol succinate (TOPROL XL) 25 MG 24 hr tablet Take 1 tablet (25 mg total) by mouth daily. 30 tablet 12  ? NON FORMULARY Vitamin B with Biotin daily    ? levonorgestrel (MIRENA) 20 MCG/24HR IUD 1 each by Intrauterine route once. (Patient not taking: Reported on 10/13/2021)    ? ?No current facility-administered medications for this visit.  ? ? ?Family History  ?Problem Relation Age of Onset  ? Hypertension Father 82  ?     alive  ? Hypertension Mother 74  ?     alive  ? Breast cancer Sister 7  ?     genetic testing neg  ? Pancreatic cancer Maternal Aunt   ? ? ?Review of Systems  ?All other systems reviewed and are negative. ? ?Exam:   ?Ht 5' 5.5" (1.664 m) Comment: reported  Wt 139 lb 3.2 oz (63.1 kg)   BMI 22.81 kg/m?   Height: 5' 5.5" (166.4 cm) (reported) ? ?General appearance: alert, cooperative and appears stated age ?Head: Normocephalic, without obvious abnormality, atraumatic ?Neck: no adenopathy, supple, symmetrical, trachea midline and thyroid normal to inspection and palpation ?Lungs: clear to auscultation bilaterally ?Breasts: normal appearance, no masses or tenderness ?Heart: regular rate and rhythm ?Abdomen: soft, non-tender; bowel sounds normal; no masses,  no organomegaly ?Extremities: extremities normal, atraumatic, no cyanosis or edema ?  Skin: Skin color, texture, turgor normal. No rashes or lesions ?Lymph nodes: Cervical, supraclavicular, and axillary nodes normal. ?No abnormal inguinal nodes palpated ?Neurologic: Grossly normal ? ? ?Pelvic: External genitalia:  no lesions ?             Urethra:  normal appearing urethra with no masses, tenderness or lesions ?             Bartholins and Skenes: normal    ?             Vagina: normal appearing vagina with normal color and no discharge, no lesions ?             Cervix: no lesions ?             Pap taken: Yes.   ?Bimanual Exam:  Uterus:  normal size, contour, position, consistency, mobility, non-tender ?              Adnexa: normal adnexa and no mass, fullness, tenderness ?              Rectovaginal: Confirms ?              Anus:  normal sphincter tone, no lesions ? ?Chaperone, Octaviano Batty, CMA, was present for exam. ? ?Assessment/Plan: ?1. Well woman exam with routine gynecological exam ?- pap and HR HPV obtained today ?- MMG 10/2020.  Has scheduled for this year.  Doing 3D. ?- colonoscopy 08/30/2018 ?- BMD will be planned closer to age 79 ?- vaccines reviewed/updated ?- lab work typically done with PCP.  Will order potassium today. ? ?2. Dysplasia of cervix, high grade CIN 2 ?- Cytology - PAP( [facility]) ? ?3. H/O LEEP with CIN 2 and negative margins ? ?4. Hot flashes ?- Follicle stimulating hormone ?- Estradiol ? ?5. Family history of breast cancer ?- has declined additional screening with MRI in the past ? ?6. Situational anxiety ?- diazepam (VALIUM) 10 MG tablet; Take 1/2 tab prior to airplane flight.  Dispense: 20 tablet; Refill: 0 ? ?7. High risk medication use ?- Potassium ? ? ?

## 2021-10-14 LAB — ESTRADIOL: Estradiol: <5 pg/mL

## 2021-10-14 LAB — FOLLICLE STIMULATING HORMONE: FSH: 81.5 m[IU]/mL

## 2021-10-14 LAB — POTASSIUM: Potassium: 3.3 mmol/L — ABNORMAL LOW (ref 3.5–5.2)

## 2021-10-15 ENCOUNTER — Encounter (HOSPITAL_BASED_OUTPATIENT_CLINIC_OR_DEPARTMENT_OTHER): Payer: Self-pay | Admitting: Obstetrics & Gynecology

## 2021-10-15 ENCOUNTER — Other Ambulatory Visit (HOSPITAL_BASED_OUTPATIENT_CLINIC_OR_DEPARTMENT_OTHER): Payer: Self-pay | Admitting: Obstetrics & Gynecology

## 2021-10-15 DIAGNOSIS — E876 Hypokalemia: Secondary | ICD-10-CM

## 2021-10-15 MED ORDER — POTASSIUM CHLORIDE CRYS ER 10 MEQ PO TBCR: EXTENDED_RELEASE_TABLET | ORAL | 0 refills | Status: DC

## 2021-10-16 MED ORDER — PROGESTERONE MICRONIZED 100 MG PO CAPS: 100.0000 mg | ORAL_CAPSULE | Freq: Every day | ORAL | 2 refills | Status: DC

## 2021-10-18 LAB — CYTOLOGY - PAP
Comment: NEGATIVE
Comment: NEGATIVE
Diagnosis: NEGATIVE
Diagnosis: REACTIVE
HPV 16: NEGATIVE
HPV 18 / 45: NEGATIVE
High risk HPV: POSITIVE — AB

## 2021-10-25 ENCOUNTER — Telehealth (HOSPITAL_BASED_OUTPATIENT_CLINIC_OR_DEPARTMENT_OTHER): Payer: Self-pay | Admitting: Obstetrics & Gynecology

## 2021-10-25 NOTE — Telephone Encounter (Signed)
Patient called and left a message to call her back to setup procedure .Per pay some where around Dec 07 2021. ?

## 2021-10-27 NOTE — Telephone Encounter (Signed)
Called pt and set up appt for colposcopy ?

## 2021-11-18 ENCOUNTER — Encounter (HOSPITAL_BASED_OUTPATIENT_CLINIC_OR_DEPARTMENT_OTHER): Payer: Self-pay | Admitting: Radiology

## 2021-11-18 ENCOUNTER — Ambulatory Visit (HOSPITAL_BASED_OUTPATIENT_CLINIC_OR_DEPARTMENT_OTHER): Payer: Managed Care, Other (non HMO) | Admitting: Radiology

## 2021-11-18 ENCOUNTER — Ambulatory Visit (HOSPITAL_BASED_OUTPATIENT_CLINIC_OR_DEPARTMENT_OTHER)
Admission: RE | Admit: 2021-11-18 | Discharge: 2021-11-18 | Disposition: A | Discharge status: Home / Self Care | Payer: Managed Care, Other (non HMO) | Source: Ambulatory Visit | Attending: Obstetrics & Gynecology | Admitting: Obstetrics & Gynecology

## 2021-11-18 ENCOUNTER — Other Ambulatory Visit (HOSPITAL_BASED_OUTPATIENT_CLINIC_OR_DEPARTMENT_OTHER): Payer: Self-pay | Admitting: Obstetrics & Gynecology

## 2021-11-18 DIAGNOSIS — Z1231 Encounter for screening mammogram for malignant neoplasm of breast: Secondary | ICD-10-CM | POA: Diagnosis present

## 2021-12-01 ENCOUNTER — Other Ambulatory Visit (HOSPITAL_BASED_OUTPATIENT_CLINIC_OR_DEPARTMENT_OTHER): Payer: Self-pay | Admitting: *Deleted

## 2021-12-01 ENCOUNTER — Encounter (HOSPITAL_BASED_OUTPATIENT_CLINIC_OR_DEPARTMENT_OTHER): Payer: Self-pay | Admitting: Obstetrics & Gynecology

## 2021-12-01 MED ORDER — FLUCONAZOLE 150 MG PO TABS: 150.0000 mg | ORAL_TABLET | Freq: Once | ORAL | 0 refills | Status: AC

## 2021-12-01 NOTE — Progress Notes (Signed)
Pt with symptoms of yeast infection. Rx sent to pharmacy for diflucan.  ?

## 2021-12-07 ENCOUNTER — Ambulatory Visit (HOSPITAL_BASED_OUTPATIENT_CLINIC_OR_DEPARTMENT_OTHER): Payer: Managed Care, Other (non HMO) | Admitting: Obstetrics & Gynecology

## 2022-01-18 ENCOUNTER — Other Ambulatory Visit (HOSPITAL_COMMUNITY)
Admission: RE | Admit: 2022-01-18 | Discharge: 2022-01-18 | Disposition: A | Discharge status: Home / Self Care | Payer: Managed Care, Other (non HMO) | Source: Ambulatory Visit | Attending: Obstetrics & Gynecology | Admitting: Obstetrics & Gynecology

## 2022-01-18 ENCOUNTER — Encounter (HOSPITAL_BASED_OUTPATIENT_CLINIC_OR_DEPARTMENT_OTHER): Payer: Self-pay | Admitting: Obstetrics & Gynecology

## 2022-01-18 ENCOUNTER — Ambulatory Visit (HOSPITAL_BASED_OUTPATIENT_CLINIC_OR_DEPARTMENT_OTHER): Payer: Managed Care, Other (non HMO) | Admitting: Obstetrics & Gynecology

## 2022-01-18 VITALS — BP 178/93 | HR 91 | Wt 139.8 lb

## 2022-01-18 DIAGNOSIS — N871 Moderate cervical dysplasia: Secondary | ICD-10-CM | POA: Diagnosis present

## 2022-01-18 DIAGNOSIS — B977 Papillomavirus as the cause of diseases classified elsewhere: Secondary | ICD-10-CM

## 2022-01-18 DIAGNOSIS — N870 Mild cervical dysplasia: Secondary | ICD-10-CM | POA: Diagnosis not present

## 2022-01-18 DIAGNOSIS — Z124 Encounter for screening for malignant neoplasm of cervix: Secondary | ICD-10-CM

## 2022-01-24 ENCOUNTER — Other Ambulatory Visit (HOSPITAL_BASED_OUTPATIENT_CLINIC_OR_DEPARTMENT_OTHER): Payer: Self-pay

## 2022-01-24 LAB — SURGICAL PATHOLOGY

## 2022-01-25 ENCOUNTER — Encounter (HOSPITAL_BASED_OUTPATIENT_CLINIC_OR_DEPARTMENT_OTHER): Payer: Self-pay | Admitting: Obstetrics & Gynecology

## 2022-01-25 LAB — CYTOLOGY - PAP
Comment: NEGATIVE
Diagnosis: NEGATIVE
High risk HPV: NEGATIVE

## 2022-02-21 ENCOUNTER — Encounter (HOSPITAL_BASED_OUTPATIENT_CLINIC_OR_DEPARTMENT_OTHER): Payer: Self-pay | Admitting: Obstetrics & Gynecology

## 2022-03-09 ENCOUNTER — Telehealth: Payer: Managed Care, Other (non HMO) | Admitting: Physician Assistant

## 2022-03-09 DIAGNOSIS — R3989 Other symptoms and signs involving the genitourinary system: Secondary | ICD-10-CM

## 2022-03-10 MED ORDER — SULFAMETHOXAZOLE-TRIMETHOPRIM 800-160 MG PO TABS: 1.0000 | ORAL_TABLET | Freq: Two times a day (BID) | ORAL | 0 refills | Status: DC

## 2022-03-10 NOTE — Progress Notes (Signed)

## 2022-03-15 ENCOUNTER — Encounter (HOSPITAL_BASED_OUTPATIENT_CLINIC_OR_DEPARTMENT_OTHER): Payer: Self-pay | Admitting: Obstetrics & Gynecology

## 2022-03-15 MED ORDER — NITROFURANTOIN MONOHYD MACRO 100 MG PO CAPS: 100.0000 mg | ORAL_CAPSULE | Freq: Two times a day (BID) | ORAL | 0 refills | Status: AC

## 2022-03-23 ENCOUNTER — Encounter (HOSPITAL_BASED_OUTPATIENT_CLINIC_OR_DEPARTMENT_OTHER): Payer: Self-pay | Admitting: Obstetrics & Gynecology

## 2022-03-27 ENCOUNTER — Other Ambulatory Visit (HOSPITAL_BASED_OUTPATIENT_CLINIC_OR_DEPARTMENT_OTHER): Payer: Self-pay | Admitting: Obstetrics & Gynecology

## 2022-03-27 MED ORDER — ESTRADIOL 0.1 MG/GM VA CREA: TOPICAL_CREAM | VAGINAL | 1 refills | Status: DC

## 2022-03-28 ENCOUNTER — Other Ambulatory Visit (HOSPITAL_BASED_OUTPATIENT_CLINIC_OR_DEPARTMENT_OTHER): Payer: Self-pay

## 2022-03-28 MED ORDER — GARDASIL 9 0.5 ML IM SUSY
0.5000 mL | PREFILLED_SYRINGE | INTRAMUSCULAR | 2 refills | Status: DC
  Filled 2022-03-28: qty 0.5, 1d supply, fill #0
  Filled 2022-05-22: qty 0.5, 1d supply, fill #1

## 2022-05-15 ENCOUNTER — Telehealth: Payer: Self-pay

## 2022-05-15 ENCOUNTER — Other Ambulatory Visit: Payer: Self-pay

## 2022-05-15 DIAGNOSIS — K862 Cyst of pancreas: Secondary | ICD-10-CM

## 2022-05-15 NOTE — Telephone Encounter (Signed)
Orders placed & schedulers notified. Patient sent mychart message.

## 2022-05-15 NOTE — Telephone Encounter (Signed)
-----   Message from Marlon Pel, RN sent at 05/15/2022 10:11 AM EDT -----  ----- Message ----- From: Marlon Pel, RN Sent: 05/14/2022  12:00 AM EDT To: Marlon Pel, RN  Patient needs MRI for pancreatic cyst.  See phone notes from 07/11/21- [facility]

## 2022-05-16 ENCOUNTER — Encounter (HOSPITAL_BASED_OUTPATIENT_CLINIC_OR_DEPARTMENT_OTHER): Payer: Self-pay | Admitting: Obstetrics & Gynecology

## 2022-05-22 ENCOUNTER — Other Ambulatory Visit (HOSPITAL_BASED_OUTPATIENT_CLINIC_OR_DEPARTMENT_OTHER): Payer: Self-pay

## 2022-05-22 ENCOUNTER — Encounter: Payer: Self-pay | Admitting: *Deleted

## 2022-05-23 NOTE — Addendum Note (Signed)
Addended by: Bonner Puna H on: 05/23/2022 01:29 PM   Modules accepted: Orders

## 2022-05-25 ENCOUNTER — Other Ambulatory Visit (HOSPITAL_BASED_OUTPATIENT_CLINIC_OR_DEPARTMENT_OTHER): Payer: Self-pay

## 2022-06-08 ENCOUNTER — Telehealth: Payer: Self-pay

## 2022-06-08 NOTE — Telephone Encounter (Signed)
Staff reminder sent to self to remind patient to schedule MRI in March 2024. Order already in place.

## 2022-06-08 NOTE — Telephone Encounter (Signed)
-----   Message from Ladene Artist, MD sent at 06/08/2022  9:13 AM EST ----- At the patients request I reviewed her abd MRIs and the follow up recommended. 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 Her first abd MRI was in Feb 2019. I spoke with her about the follow up recommendations and with shared decision making we agreed on a follow up abd MRI/MRCP in March 2024. If stable then we can stop. MS

## 2022-07-09 ENCOUNTER — Encounter (HOSPITAL_BASED_OUTPATIENT_CLINIC_OR_DEPARTMENT_OTHER): Payer: Self-pay | Admitting: Obstetrics & Gynecology

## 2022-07-09 DIAGNOSIS — N644 Mastodynia: Secondary | ICD-10-CM

## 2022-07-26 ENCOUNTER — Other Ambulatory Visit (HOSPITAL_BASED_OUTPATIENT_CLINIC_OR_DEPARTMENT_OTHER): Payer: Self-pay | Admitting: *Deleted

## 2022-07-26 DIAGNOSIS — N644 Mastodynia: Secondary | ICD-10-CM

## 2022-08-28 ENCOUNTER — Other Ambulatory Visit (HOSPITAL_BASED_OUTPATIENT_CLINIC_OR_DEPARTMENT_OTHER): Payer: Self-pay | Admitting: Obstetrics & Gynecology

## 2022-08-28 NOTE — Progress Notes (Signed)
Refill request came for estradiol vaginal cream.  I cannot find the pharmacy in [facility] search so called pharmacy directly and gave ok for refill.  1 gram pv twice weekly.  #42.5 gram/1RF.  Provided fax number for future refill requests as well.

## 2022-09-04 ENCOUNTER — Telehealth: Payer: Self-pay

## 2022-09-04 ENCOUNTER — Other Ambulatory Visit (HOSPITAL_BASED_OUTPATIENT_CLINIC_OR_DEPARTMENT_OTHER): Payer: Self-pay | Admitting: Obstetrics & Gynecology

## 2022-09-04 ENCOUNTER — Encounter (HOSPITAL_BASED_OUTPATIENT_CLINIC_OR_DEPARTMENT_OTHER): Payer: Self-pay | Admitting: Obstetrics & Gynecology

## 2022-09-04 MED ORDER — NITROFURANTOIN MONOHYD MACRO 100 MG PO CAPS: 100.0000 mg | ORAL_CAPSULE | Freq: Two times a day (BID) | ORAL | 0 refills | Status: DC

## 2022-09-04 NOTE — Telephone Encounter (Signed)
-----   Message from Ladene Artist, MD sent at 09/04/2022 10:57 AM EST ----- This patient contacted me about scheduling her follow up abd MRI with and without contrast in early March to evaluate hepatic hemangiomas, pancreatic cysts and a small right Spigelian hernia. Please see prior MRIs.  Would you please contact her to schedule. Thanks.

## 2022-09-04 NOTE — Telephone Encounter (Signed)
Order for MRI entered and sent to the schedulers.  Pt aware

## 2022-09-18 ENCOUNTER — Encounter: Payer: Self-pay | Admitting: *Deleted

## 2022-09-29 ENCOUNTER — Other Ambulatory Visit: Payer: Self-pay

## 2022-09-29 DIAGNOSIS — K862 Cyst of pancreas: Secondary | ICD-10-CM

## 2022-10-18 ENCOUNTER — Ambulatory Visit (HOSPITAL_BASED_OUTPATIENT_CLINIC_OR_DEPARTMENT_OTHER): Payer: Managed Care, Other (non HMO) | Admitting: Obstetrics & Gynecology

## 2022-11-09 ENCOUNTER — Other Ambulatory Visit (HOSPITAL_BASED_OUTPATIENT_CLINIC_OR_DEPARTMENT_OTHER): Payer: Self-pay | Admitting: Obstetrics & Gynecology

## 2022-11-09 ENCOUNTER — Encounter (HOSPITAL_BASED_OUTPATIENT_CLINIC_OR_DEPARTMENT_OTHER): Payer: Self-pay

## 2022-11-09 ENCOUNTER — Other Ambulatory Visit: Payer: Self-pay

## 2022-11-09 ENCOUNTER — Ambulatory Visit (HOSPITAL_BASED_OUTPATIENT_CLINIC_OR_DEPARTMENT_OTHER): Payer: Managed Care, Other (non HMO) | Admitting: Obstetrics & Gynecology

## 2022-11-09 DIAGNOSIS — Z1231 Encounter for screening mammogram for malignant neoplasm of breast: Secondary | ICD-10-CM

## 2022-11-17 ENCOUNTER — Other Ambulatory Visit (HOSPITAL_BASED_OUTPATIENT_CLINIC_OR_DEPARTMENT_OTHER): Payer: Self-pay | Admitting: Obstetrics & Gynecology

## 2022-11-17 ENCOUNTER — Encounter (HOSPITAL_BASED_OUTPATIENT_CLINIC_OR_DEPARTMENT_OTHER): Payer: Self-pay | Admitting: Obstetrics & Gynecology

## 2022-11-17 MED ORDER — ESTRADIOL 0.1 MG/GM VA CREA: TOPICAL_CREAM | VAGINAL | 3 refills | Status: DC

## 2022-11-22 ENCOUNTER — Other Ambulatory Visit (HOSPITAL_BASED_OUTPATIENT_CLINIC_OR_DEPARTMENT_OTHER): Payer: Self-pay | Admitting: Obstetrics & Gynecology

## 2022-12-15 IMAGING — MG DIGITAL SCREENING BREAST BILAT IMPLANT W/ TOMO W/ CAD
8 of 12 series · 8 of 28 positions shown · non-contrast
Comparison: Previous exam(s).

CLINICAL DATA: Screening.

EXAM:
DIGITAL SCREENING BILATERAL MAMMOGRAM WITH IMPLANTS, CAD AND
TOMOSYNTHESIS
TECHNIQUE: Bilateral screening digital craniocaudal and mediolateral oblique
mammograms were obtained. Bilateral screening digital breast
tomosynthesis was performed. The images were evaluated with
computer-aided detection. Standard and/or implant displaced views
were performed.

[R CC]
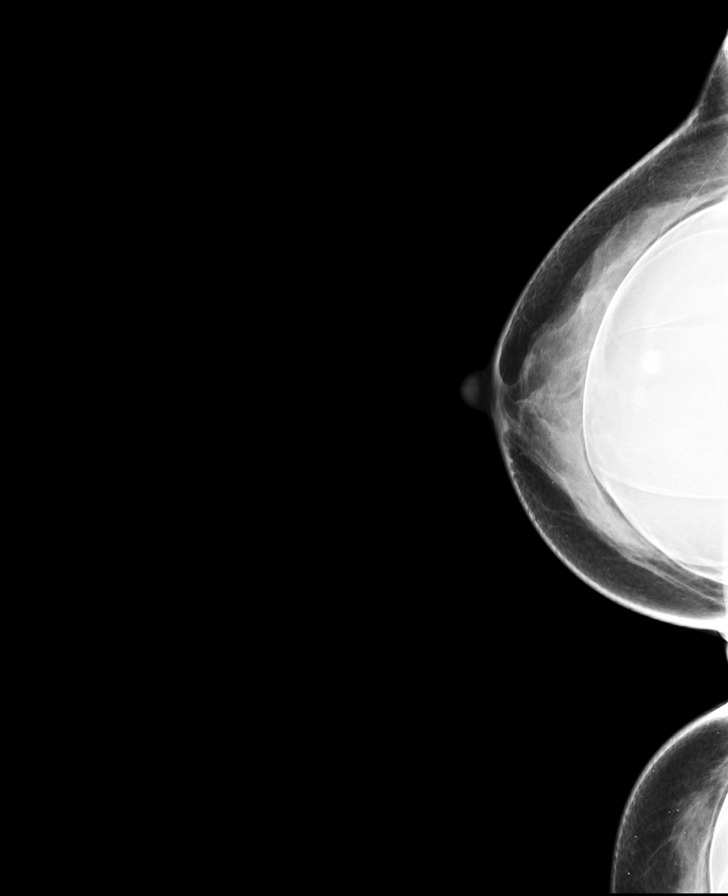

[R MLO]
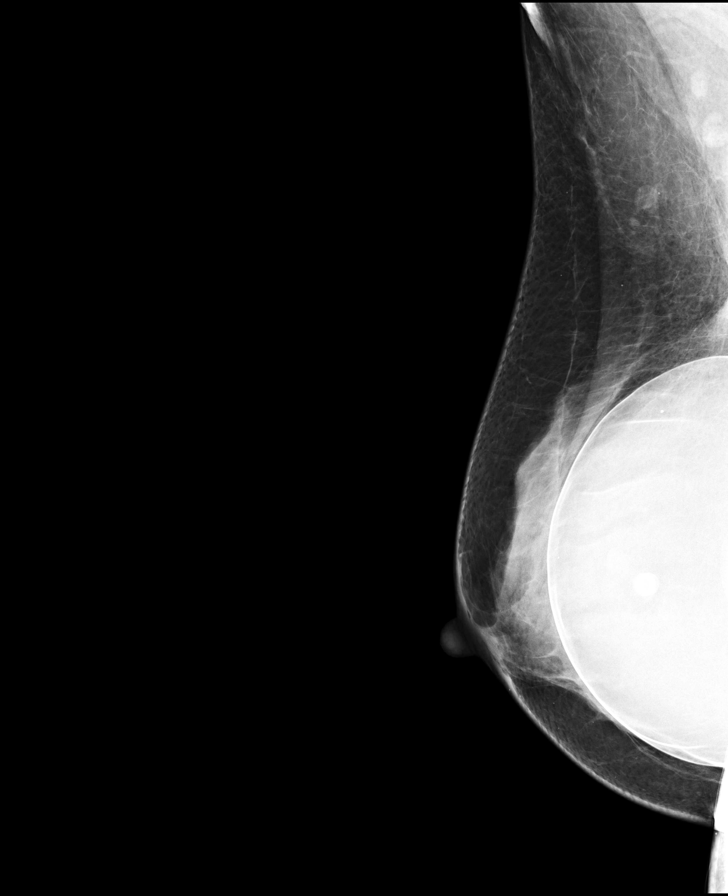

[L MLO]
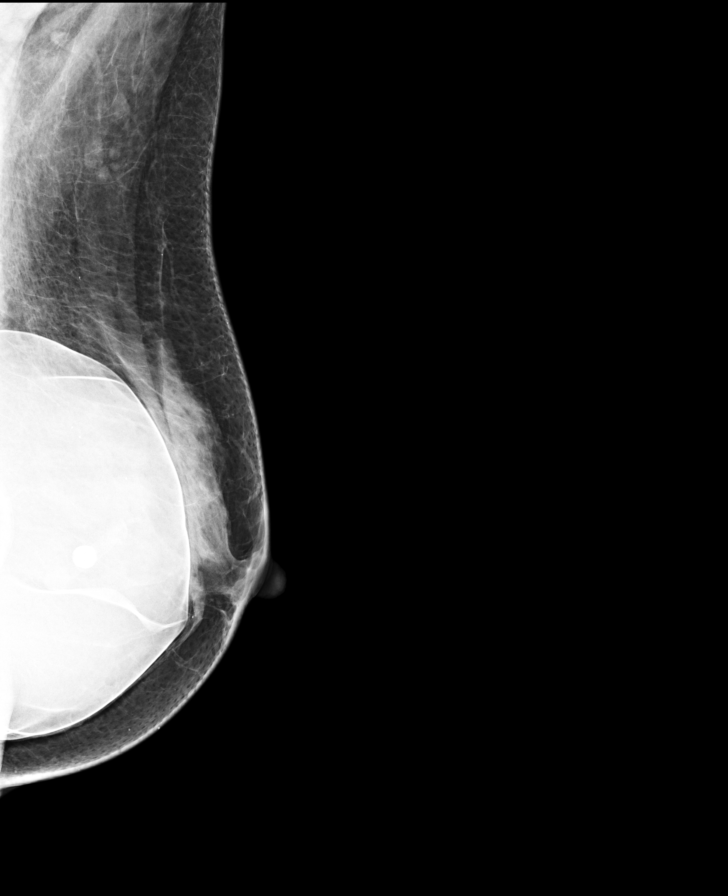

[L CC]
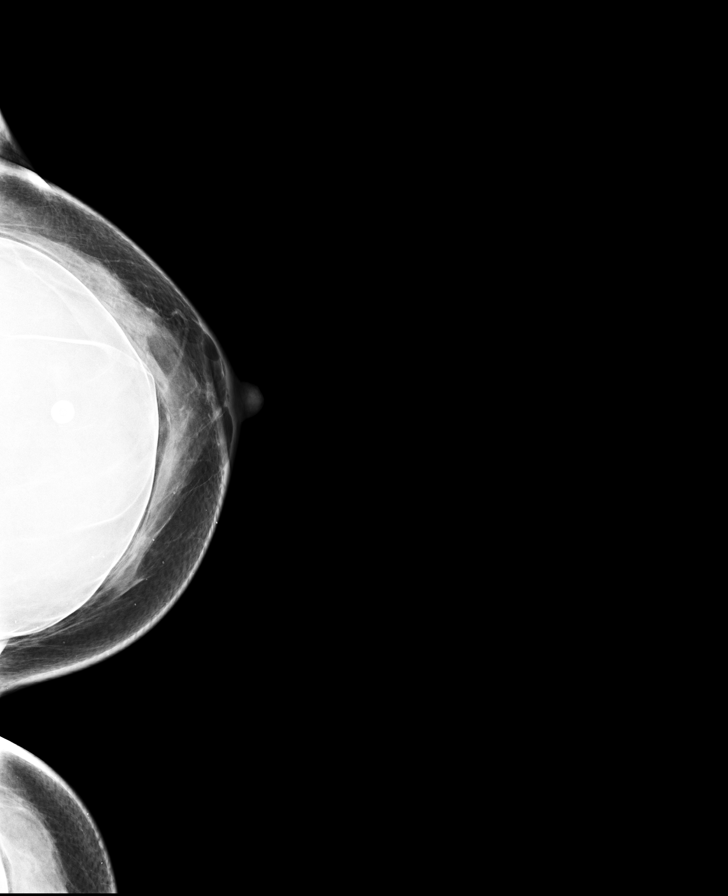

[L MLO synth-2D]
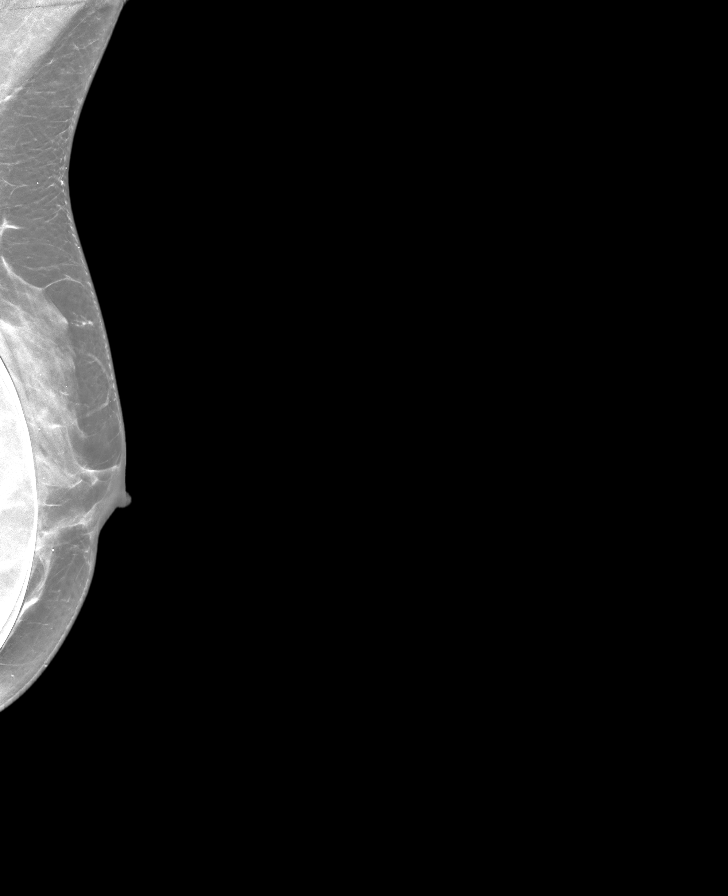

[R CC synth-2D]
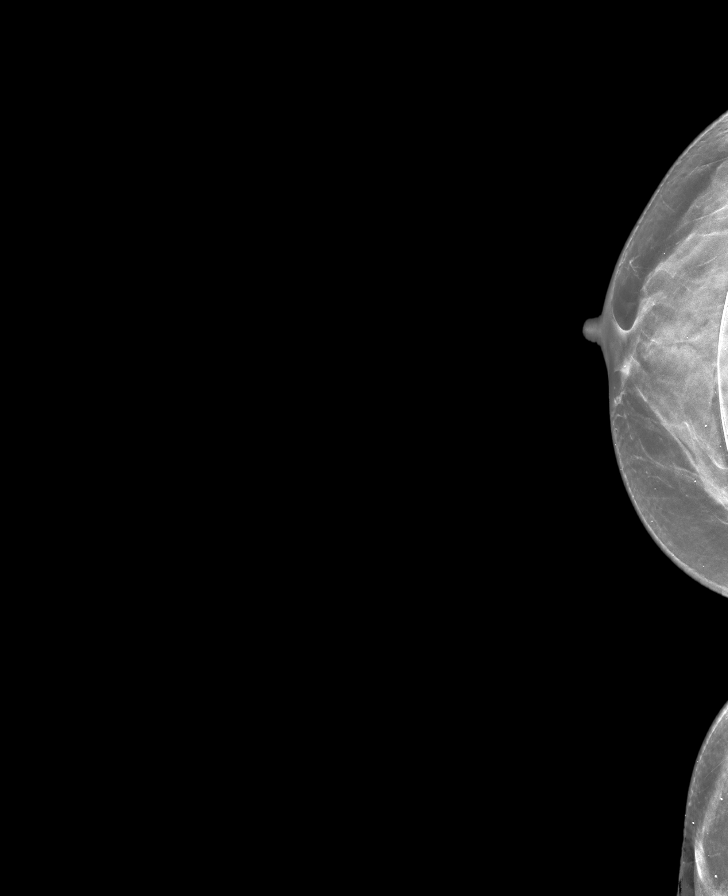

[R MLO synth-2D]
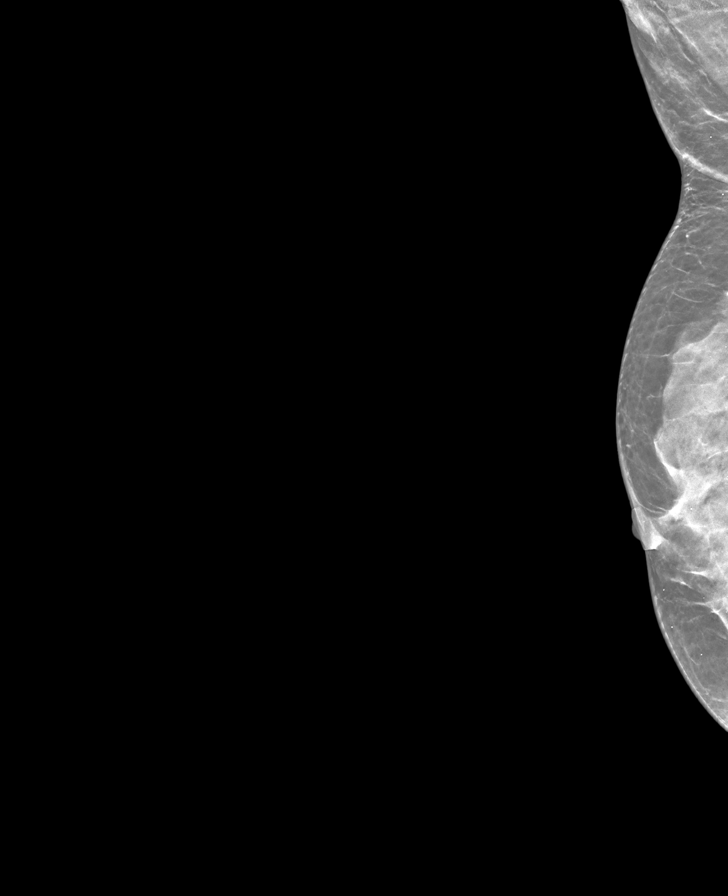

[L CC synth-2D]
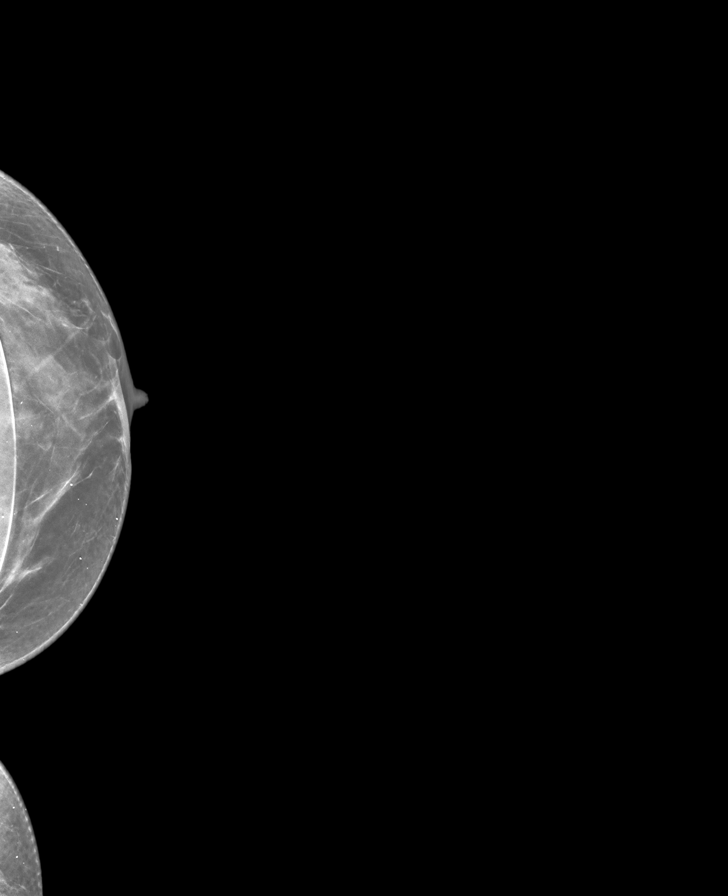

[8 of 28 positions shown; findings below may reference images not displayed]

ACR Breast Density Category c: The breast tissue is heterogeneously
dense, which may obscure small masses.
FINDINGS: The patient has retropectoral implants. There are no findings
suspicious for malignancy.
IMPRESSION: No mammographic evidence of malignancy. A result letter of this
screening mammogram will be mailed directly to the patient.

RECOMMENDATION:
Screening mammogram in one year. (Code:LT-E-7TH)

BI-RADS CATEGORY  1:  Negative.

## 2022-12-21 ENCOUNTER — Encounter (HOSPITAL_BASED_OUTPATIENT_CLINIC_OR_DEPARTMENT_OTHER): Payer: Self-pay | Admitting: Obstetrics & Gynecology

## 2022-12-21 ENCOUNTER — Ambulatory Visit (INDEPENDENT_AMBULATORY_CARE_PROVIDER_SITE_OTHER): Payer: No Typology Code available for payment source | Admitting: Obstetrics & Gynecology

## 2022-12-21 ENCOUNTER — Other Ambulatory Visit (HOSPITAL_COMMUNITY)
Admission: RE | Admit: 2022-12-21 | Discharge: 2022-12-21 | Disposition: A | Discharge status: Home / Self Care | Payer: No Typology Code available for payment source | Source: Ambulatory Visit | Attending: Obstetrics & Gynecology | Admitting: Obstetrics & Gynecology

## 2022-12-21 ENCOUNTER — Ambulatory Visit (HOSPITAL_BASED_OUTPATIENT_CLINIC_OR_DEPARTMENT_OTHER)
Admission: RE | Admit: 2022-12-21 | Discharge: 2022-12-21 | Disposition: A | Discharge status: Home / Self Care | Payer: No Typology Code available for payment source | Source: Ambulatory Visit | Attending: Obstetrics & Gynecology | Admitting: Obstetrics & Gynecology

## 2022-12-21 ENCOUNTER — Other Ambulatory Visit (HOSPITAL_BASED_OUTPATIENT_CLINIC_OR_DEPARTMENT_OTHER): Payer: Self-pay | Admitting: Obstetrics & Gynecology

## 2022-12-21 VITALS — BP 126/79 | HR 78 | Ht 65.0 in | Wt 140.2 lb

## 2022-12-21 DIAGNOSIS — Z1231 Encounter for screening mammogram for malignant neoplasm of breast: Secondary | ICD-10-CM | POA: Diagnosis present

## 2022-12-21 DIAGNOSIS — Z124 Encounter for screening for malignant neoplasm of cervix: Secondary | ICD-10-CM

## 2022-12-21 DIAGNOSIS — N871 Moderate cervical dysplasia: Secondary | ICD-10-CM

## 2022-12-21 DIAGNOSIS — F418 Other specified anxiety disorders: Secondary | ICD-10-CM

## 2022-12-21 DIAGNOSIS — Z01419 Encounter for gynecological examination (general) (routine) without abnormal findings: Secondary | ICD-10-CM

## 2022-12-21 DIAGNOSIS — Z9889 Other specified postprocedural states: Secondary | ICD-10-CM

## 2022-12-21 MED ORDER — DIAZEPAM 10 MG PO TABS
ORAL_TABLET | ORAL | 0 refills | Status: DC
Start: 2022-12-21 — End: 2024-01-02

## 2022-12-21 NOTE — Progress Notes (Signed)
57 y.o. [id number] Married White or Caucasian female here for annual exam.  Doing well.  Denies vaginal bleeding.  Is using some vaginal estrogen cream.  This has helped her hot flashes a lot.    Does need valium RF.  Uses with flying.    Denies vaginal bleeding.    No LMP recorded. (Menstrual status: IUD).          Sexually active: Yes.    The current method of family planning is post menopausal status.    Smoker:  yes  Health Maintenance: Pap:  01/18/2022 Negative History of abnormal Pap:  h/o LEEP 2022 MMG:  12/21/2022--done today Colonoscopy:  09/19/2021 Screening Labs: Done in April.  Reports lab work was good.  LDL was 130's and HDLs was in the 70s.     reports that she has never smoked. She has never used smokeless tobacco. She reports current alcohol use of about 3.0 - 4.0 standard drinks of alcohol per week. She reports that she does not use drugs.  Past Medical History:  Diagnosis Date   Hemangioma of liver    Hyperlipidemia    Hypertension    Juvenile rheumatic fever    no MVP, negative ECHO 12/10   PVC's (premature ventricular contractions)    Spigelian hernia    Vitamin D deficiency     Past Surgical History:  Procedure Laterality Date   AUGMENTATION MAMMAPLASTY  07/2012   CESAREAN SECTION  2/07   35 weeks   PELVIC LAPAROSCOPY  2006   w/HSG, endometrioma    Current Outpatient Medications  Medication Sig Dispense Refill   estradiol (ESTRACE) 0.1 MG/GM vaginal cream 1 gram vaginally twice weekly 42.5 g 3   hpv 9-valent vaccine (GARDASIL 9) prefilled syringe Inject 0.5 mLs into the muscle. 0.5 mL 2   metoprolol succinate (TOPROL XL) 25 MG 24 hr tablet Take 1 tablet (25 mg total) by mouth daily. 30 tablet 12   diazepam (VALIUM) 10 MG tablet Take 1/2 tab prior to airplane flight. 20 tablet 0   No current facility-administered medications for this visit.    Family History  Problem Relation Age of Onset   Hypertension Father 18       alive   Hypertension Mother  41       alive   Breast cancer Sister 11       genetic testing neg   Pancreatic cancer Maternal Aunt     ROS: Constitutional: negative Genitourinary:negative  Exam:   BP 126/79 (BP Location: Left Arm, Patient Position: Sitting, Cuff Size: Normal) Comment: Patient wanted to record her blood pressure she took at home  Pulse 78   Ht 5\' 5"  (1.651 m) Comment: Reported  Wt 140 lb 3.2 oz (63.6 kg)   BMI 23.33 kg/m   Height: 5\' 5"  (165.1 cm) (Reported)  General appearance: alert, cooperative and appears stated age Head: Normocephalic, without obvious abnormality, atraumatic Neck: no adenopathy, supple, symmetrical, trachea midline and thyroid normal to inspection and palpation Lungs: clear to auscultation bilaterally Breasts: normal appearance, no masses or tenderness Heart: regular rate and rhythm Abdomen: soft, non-tender; bowel sounds normal; no masses,  no organomegaly, small umbilical hernia Extremities: extremities normal, atraumatic, no cyanosis or edema Skin: Skin color, texture, turgor normal. No rashes or lesions Lymph nodes: Cervical, supraclavicular, and axillary nodes normal. No abnormal inguinal nodes palpated Neurologic: Grossly normal   Pelvic: External genitalia:  no lesions              Urethra:  normal appearing urethra with no masses, tenderness or lesions              Bartholins and Skenes: normal                 Vagina: normal appearing vagina with normal color and no discharge, no lesions              Cervix: no lesions              Pap taken: Yes.   Bimanual Exam:  Uterus:  normal size, contour, position, consistency, mobility, non-tender              Adnexa: no mass, fullness, tenderness               Rectovaginal: Confirms               Anus:  normal sphincter tone, no lesions  Chaperone, Ina Homes, CMA, was present for exam.  Assessment/Plan: 1. Well woman exam with routine gynecological exam - Pap smear and HR HPV obtained today - Mammogram done  today - Cologuard negative 09/19/2021 - lab work done with PCP, April, 2024 - vaccines reviewed/updated  2. Dysplasia of cervix, high grade CIN 2 - Cytology - PAP( [facility])  3. Cervical cancer screening  4. Situational anxiety - diazepam (VALIUM) 10 MG tablet; Take 1/2 tab prior to airplane flight.  Dispense: 20 tablet; Refill: 0  5. H/O LEEP - 01/18/2022

## 2022-12-29 LAB — CYTOLOGY - PAP
Comment: NEGATIVE
Comment: NEGATIVE
Comment: NEGATIVE
Diagnosis: UNDETERMINED — AB
HPV 16: NEGATIVE
HPV 18 / 45: NEGATIVE
High risk HPV: POSITIVE — AB

## 2023-01-01 ENCOUNTER — Ambulatory Visit (HOSPITAL_BASED_OUTPATIENT_CLINIC_OR_DEPARTMENT_OTHER): Payer: No Typology Code available for payment source | Admitting: Obstetrics & Gynecology

## 2023-01-01 ENCOUNTER — Encounter (HOSPITAL_BASED_OUTPATIENT_CLINIC_OR_DEPARTMENT_OTHER): Payer: Self-pay | Admitting: Obstetrics & Gynecology

## 2023-04-12 ENCOUNTER — Ambulatory Visit (HOSPITAL_BASED_OUTPATIENT_CLINIC_OR_DEPARTMENT_OTHER): Payer: No Typology Code available for payment source | Admitting: Obstetrics & Gynecology

## 2023-04-24 ENCOUNTER — Encounter (HOSPITAL_BASED_OUTPATIENT_CLINIC_OR_DEPARTMENT_OTHER): Payer: Self-pay | Admitting: Obstetrics & Gynecology

## 2023-04-24 ENCOUNTER — Other Ambulatory Visit (HOSPITAL_COMMUNITY)
Admission: RE | Admit: 2023-04-24 | Discharge: 2023-04-24 | Disposition: A | Discharge status: Home / Self Care | Payer: No Typology Code available for payment source | Source: Ambulatory Visit | Attending: Obstetrics & Gynecology | Admitting: Obstetrics & Gynecology

## 2023-04-24 ENCOUNTER — Ambulatory Visit (HOSPITAL_BASED_OUTPATIENT_CLINIC_OR_DEPARTMENT_OTHER): Payer: No Typology Code available for payment source | Admitting: Obstetrics & Gynecology

## 2023-04-24 VITALS — BP 112/73 | HR 70 | Ht 65.5 in | Wt 144.0 lb

## 2023-04-24 DIAGNOSIS — R87610 Atypical squamous cells of undetermined significance on cytologic smear of cervix (ASC-US): Secondary | ICD-10-CM | POA: Diagnosis present

## 2023-04-24 DIAGNOSIS — R87810 Cervical high risk human papillomavirus (HPV) DNA test positive: Secondary | ICD-10-CM

## 2023-04-24 NOTE — Progress Notes (Signed)
57 y.o. [id number] Married Not Hispanic or Latino female here for colposcopy with possible biopsies and/or ECC due to ASCUS Pap with +HR HPV obtained 12/21/2022.    Prior evaluation/treatment:  h/o LEEP.  Patient's last menstrual period was 08/11/2019.          Sexually active: Yes.      Patient has been counseled about results and procedure.  All questions answered.  Pt ready to proceed.  Consent obtained.  Vitals:   04/24/23 1443  BP: 112/73  Pulse: 70    General appearance: alert, cooperative and appears stated age Lymph nodes: No abnormal inguinal nodes palpated Neurologic: Grossly normal  Pelvic: External genitalia:  no lesions              Urethra:  normal appearing urethra with no masses, tenderness or lesions              Bartholins and Skenes: normal                 Vagina: normal appearing vagina with normal color and no discharge, no lesions               Physical Exam Constitutional:      Appearance: Normal appearance.  Neurological:     Mental Status: She is alert.  Psychiatric:        Mood and Affect: Mood normal.    Speculum placed.  3% acetic acid applied to cervix for >45 seconds.  Cervix visualized with both 7.5X and 15X magnification.  Green filter also used.  Lugols solution was used.  Findings:  no abnormal staining.  Biopsy:  none obtained.  ECC:  was performed.  Monsel's was not needed.  Excellent hemostasis was present.  Pt tolerated procedure well and all instruments were removed.   Chaperone, Raechel Ache, RN, was present during procedure.  Assessment/Plan: 1. ASCUS with positive high risk HPV cervical - Surgical pathology( [facility]/ [facility])  - Pathology results will be called to patient and follow-up planned pending results.

## 2023-04-25 LAB — SURGICAL PATHOLOGY

## 2023-08-10 ENCOUNTER — Telehealth: Payer: No Typology Code available for payment source | Admitting: Family Medicine

## 2023-08-10 ENCOUNTER — Other Ambulatory Visit (HOSPITAL_BASED_OUTPATIENT_CLINIC_OR_DEPARTMENT_OTHER): Payer: Self-pay | Admitting: Obstetrics & Gynecology

## 2023-08-10 ENCOUNTER — Encounter (HOSPITAL_BASED_OUTPATIENT_CLINIC_OR_DEPARTMENT_OTHER): Payer: Self-pay | Admitting: Obstetrics & Gynecology

## 2023-08-10 DIAGNOSIS — R3989 Other symptoms and signs involving the genitourinary system: Secondary | ICD-10-CM

## 2023-08-10 DIAGNOSIS — N3000 Acute cystitis without hematuria: Secondary | ICD-10-CM

## 2023-08-10 MED ORDER — SULFAMETHOXAZOLE-TRIMETHOPRIM 800-160 MG PO TABS: 1.0000 | ORAL_TABLET | Freq: Two times a day (BID) | ORAL | 0 refills | Status: AC

## 2023-08-10 MED ORDER — NITROFURANTOIN MONOHYD MACRO 100 MG PO CAPS
100.0000 mg | ORAL_CAPSULE | Freq: Two times a day (BID) | ORAL | 0 refills | Status: DC
Start: 2023-08-10 — End: 2024-05-21

## 2023-08-10 NOTE — Patient Instructions (Signed)
Urinary Tract Infection, Adult  A urinary tract infection (UTI) is an infection of any part of the urinary tract. The urinary tract includes the kidneys, ureters, bladder, and urethra. These organs make, store, and get rid of urine in the body. An upper UTI affects the ureters and kidneys. A lower UTI affects the bladder and urethra. What are the causes? Most urinary tract infections are caused by bacteria in your genital area around your urethra, where urine leaves your body. These bacteria grow and cause inflammation of your urinary tract. What increases the risk? You are more likely to develop this condition if: You have a urinary catheter that stays in place. You are not able to control when you urinate or have a bowel movement (incontinence). You are female and you: Use a spermicide or diaphragm for birth control. Have low estrogen levels. Are pregnant. You have certain genes that increase your risk. You are sexually active. You take antibiotic medicines. You have a condition that causes your flow of urine to slow down, such as: An enlarged prostate, if you are female. Blockage in your urethra. A kidney stone. A nerve condition that affects your bladder control (neurogenic bladder). Not getting enough to drink, or not urinating often. You have certain medical conditions, such as: Diabetes. A weak disease-fighting system (immunesystem). Sickle cell disease. Gout. Spinal cord injury. What are the signs or symptoms? Symptoms of this condition include: Needing to urinate right away (urgency). Frequent urination. This may include small amounts of urine each time you urinate. Pain or burning with urination. Blood in the urine. Urine that smells bad or unusual. Trouble urinating. Cloudy urine. Vaginal discharge, if you are female. Pain in the abdomen or the lower back. You may also have: Vomiting or a decreased appetite. Confusion. Irritability or tiredness. A fever or  chills. Diarrhea. The first symptom in older adults may be confusion. In some cases, they may not have any symptoms until the infection has worsened. How is this diagnosed? This condition is diagnosed based on your medical history and a physical exam. You may also have other tests, including: Urine tests. Blood tests. Tests for STIs (sexually transmitted infections). If you have had more than one UTI, a cystoscopy or imaging studies may be done to determine the cause of the infections. How is this treated? Treatment for this condition includes: Antibiotic medicine. Over-the-counter medicines to treat discomfort. Drinking enough water to stay hydrated. If you have frequent infections or have other conditions such as a kidney stone, you may need to see a health care provider who specializes in the urinary tract (urologist). In rare cases, urinary tract infections can cause sepsis. Sepsis is a life-threatening condition that occurs when the body responds to an infection. Sepsis is treated in the hospital with IV antibiotics, fluids, and other medicines. Follow these instructions at home:  Medicines Take over-the-counter and prescription medicines only as told by your health care provider. If you were prescribed an antibiotic medicine, take it as told by your health care provider. Do not stop using the antibiotic even if you start to feel better. General instructions Make sure you: Empty your bladder often and completely. Do not hold urine for long periods of time. Empty your bladder after sex. Wipe from front to back after urinating or having a bowel movement if you are female. Use each tissue only one time when you wipe. Drink enough fluid to keep your urine pale yellow. Keep all follow-up visits. This is important. Contact a health   care provider if: Your symptoms do not get better after 1-2 days. Your symptoms go away and then return. Get help right away if: You have severe pain in  your back or your lower abdomen. You have a fever or chills. You have nausea or vomiting. Summary A urinary tract infection (UTI) is an infection of any part of the urinary tract, which includes the kidneys, ureters, bladder, and urethra. Most urinary tract infections are caused by bacteria in your genital area. Treatment for this condition often includes antibiotic medicines. If you were prescribed an antibiotic medicine, take it as told by your health care provider. Do not stop using the antibiotic even if you start to feel better. Keep all follow-up visits. This is important. This information is not intended to replace advice given to you by your health care provider. Make sure you discuss any questions you have with your health care provider. Document Revised: 02/22/2020 Document Reviewed: 02/27/2020 Elsevier Patient Education  2024 [employer]  

## 2023-08-10 NOTE — Progress Notes (Signed)
 Virtual Visit Consent   Sandra Mcgrath, you are scheduled for a virtual visit with a [facility] provider today. Just as with appointments in the office, your consent must be obtained to participate. Your consent will be active for this visit and any virtual visit you may have with one of our providers in the next 365 days. If you have a MyChart account, a copy of this consent can be sent to you electronically.  As this is a virtual visit, video technology does not allow for your provider to perform a traditional examination. This may limit your provider's ability to fully assess your condition. If your provider identifies any concerns that need to be evaluated in person or the need to arrange testing (such as labs, EKG, etc.), we will make arrangements to do so. Although advances in technology are sophisticated, we cannot ensure that it will always work on either your end or our end. If the connection with a video visit is poor, the visit may have to be switched to a telephone visit. With either a video or telephone visit, we are not always able to ensure that we have a secure connection.  By engaging in this virtual visit, you consent to the provision of healthcare and authorize for your insurance to be billed (if applicable) for the services provided during this visit. Depending on your insurance coverage, you may receive a charge related to this service.  I need to obtain your verbal consent now. Are you willing to proceed with your visit today? Sandra Mcgrath has provided verbal consent on 08/10/2023 for a virtual visit (video or telephone). Loa Lamp, FNP  Date: 08/10/2023 11:21 AM  Virtual Visit via Video Note   I, Loa Lamp, connected with  Sandra Mcgrath  ([id number], 21-Mar-1966) on 08/10/23 at 11:30 AM EST by a video-enabled telemedicine application and verified that I am speaking with the correct person using two identifiers.  Location: Patient: Virtual  Visit Location Patient: Home Provider: Virtual Visit Location Provider: Home Office   I discussed the limitations of evaluation and management by telemedicine and the availability of in person appointments. The patient expressed understanding and agreed to proceed.    History of Present Illness: Sandra Mcgrath is a 58 y.o. who identifies as a female who was assigned female at birth, and is being seen today for burning, frequency and urgency with urination starting last night. No fever. SABRA  HPI: HPI  Problems:  Patient Active Problem List   Diagnosis Date Noted   Abdominal wall hernia 02/04/2021   H/O LEEP 11/18/2020   Dysplasia of cervix, high grade CIN 2 11/18/2020   Family history of breast cancer 07/14/2018   PREMATURE VENTRICULAR CONTRACTIONS 04/27/2009   PALPITATIONS 04/27/2009    Allergies: No Known Allergies Medications:  Current Outpatient Medications:    sulfamethoxazole -trimethoprim  (BACTRIM  DS) 800-160 MG tablet, Take 1 tablet by mouth 2 (two) times daily for 7 days., Disp: 14 tablet, Rfl: 0   diazepam  (VALIUM ) 10 MG tablet, Take 1/2 tab prior to airplane flight., Disp: 20 tablet, Rfl: 0   estradiol  (ESTRACE ) 0.1 MG/GM vaginal cream, 1 gram vaginally twice weekly, Disp: 42.5 g, Rfl: 3   hpv 9-valent vaccine (GARDASIL  9) prefilled syringe, Inject 0.5 mLs into the muscle., Disp: 0.5 mL, Rfl: 2   metoprolol  succinate (TOPROL  XL) 25 MG 24 hr tablet, Take 1 tablet (25 mg total) by mouth daily., Disp: 30 tablet, Rfl: 12  Observations/Objective: Patient is well-developed, well-nourished in no acute  distress.  Resting comfortably  at home.  Head is normocephalic, atraumatic.  No labored breathing.  Speech is clear and coherent with logical content.  Patient is alert and oriented at baseline.    Assessment and Plan: There are no diagnoses linked to this encounter. Increase fluids, continue pyridium, follow up as needed if sx persist or worsen.   Follow Up  Instructions: I discussed the assessment and treatment plan with the patient. The patient was provided an opportunity to ask questions and all were answered. The patient agreed with the plan and demonstrated an understanding of the instructions.  A copy of instructions were sent to the patient via MyChart unless otherwise noted below.     The patient was advised to call back or seek an in-person evaluation if the symptoms worsen or if the condition fails to improve as anticipated.    Enes Wegener, FNP

## 2023-11-07 ENCOUNTER — Other Ambulatory Visit (HOSPITAL_BASED_OUTPATIENT_CLINIC_OR_DEPARTMENT_OTHER): Payer: Self-pay | Admitting: Obstetrics & Gynecology

## 2023-11-07 DIAGNOSIS — Z1231 Encounter for screening mammogram for malignant neoplasm of breast: Secondary | ICD-10-CM

## 2023-12-25 HISTORY — PX: HERNIA REPAIR: SHX51

## 2024-01-02 ENCOUNTER — Encounter (HOSPITAL_BASED_OUTPATIENT_CLINIC_OR_DEPARTMENT_OTHER): Payer: Self-pay | Admitting: Obstetrics & Gynecology

## 2024-01-02 ENCOUNTER — Other Ambulatory Visit (HOSPITAL_BASED_OUTPATIENT_CLINIC_OR_DEPARTMENT_OTHER): Payer: Self-pay | Admitting: Obstetrics & Gynecology

## 2024-01-02 DIAGNOSIS — F418 Other specified anxiety disorders: Secondary | ICD-10-CM

## 2024-01-02 MED ORDER — DIAZEPAM 10 MG PO TABS: ORAL_TABLET | ORAL | 0 refills | Status: DC

## 2024-01-17 ENCOUNTER — Other Ambulatory Visit (HOSPITAL_BASED_OUTPATIENT_CLINIC_OR_DEPARTMENT_OTHER): Payer: Self-pay | Admitting: Obstetrics & Gynecology

## 2024-01-22 ENCOUNTER — Ambulatory Visit (HOSPITAL_BASED_OUTPATIENT_CLINIC_OR_DEPARTMENT_OTHER)
Admission: RE | Admit: 2024-01-22 | Discharge: 2024-01-22 | Disposition: A | Discharge status: Home / Self Care | Source: Ambulatory Visit | Attending: Obstetrics & Gynecology | Admitting: Obstetrics & Gynecology

## 2024-01-22 ENCOUNTER — Encounter (HOSPITAL_BASED_OUTPATIENT_CLINIC_OR_DEPARTMENT_OTHER): Payer: Self-pay | Admitting: Radiology

## 2024-01-22 ENCOUNTER — Other Ambulatory Visit (HOSPITAL_BASED_OUTPATIENT_CLINIC_OR_DEPARTMENT_OTHER): Payer: Self-pay | Admitting: Obstetrics & Gynecology

## 2024-01-22 DIAGNOSIS — Z1231 Encounter for screening mammogram for malignant neoplasm of breast: Secondary | ICD-10-CM | POA: Insufficient documentation

## 2024-04-21 ENCOUNTER — Ambulatory Visit (HOSPITAL_BASED_OUTPATIENT_CLINIC_OR_DEPARTMENT_OTHER): Payer: No Typology Code available for payment source | Admitting: Obstetrics & Gynecology

## 2024-05-21 ENCOUNTER — Other Ambulatory Visit (HOSPITAL_COMMUNITY)
Admission: RE | Admit: 2024-05-21 | Discharge: 2024-05-21 | Disposition: A | Discharge status: Home / Self Care | Source: Ambulatory Visit | Attending: Obstetrics & Gynecology | Admitting: Obstetrics & Gynecology

## 2024-05-21 ENCOUNTER — Encounter (HOSPITAL_BASED_OUTPATIENT_CLINIC_OR_DEPARTMENT_OTHER): Payer: Self-pay | Admitting: Obstetrics & Gynecology

## 2024-05-21 ENCOUNTER — Ambulatory Visit (HOSPITAL_BASED_OUTPATIENT_CLINIC_OR_DEPARTMENT_OTHER): Admitting: Obstetrics & Gynecology

## 2024-05-21 VITALS — BP 102/64 | HR 77 | Temp 97.7°F | Resp 18 | Wt 140.2 lb

## 2024-05-21 DIAGNOSIS — F418 Other specified anxiety disorders: Secondary | ICD-10-CM

## 2024-05-21 DIAGNOSIS — Z803 Family history of malignant neoplasm of breast: Secondary | ICD-10-CM

## 2024-05-21 DIAGNOSIS — G43109 Migraine with aura, not intractable, without status migrainosus: Secondary | ICD-10-CM | POA: Insufficient documentation

## 2024-05-21 DIAGNOSIS — Z01419 Encounter for gynecological examination (general) (routine) without abnormal findings: Secondary | ICD-10-CM

## 2024-05-21 DIAGNOSIS — Z9889 Other specified postprocedural states: Secondary | ICD-10-CM

## 2024-05-21 DIAGNOSIS — N871 Moderate cervical dysplasia: Secondary | ICD-10-CM

## 2024-05-21 MED ORDER — DIAZEPAM 10 MG PO TABS
ORAL_TABLET | ORAL | 0 refills | Status: AC
Start: 2024-05-21 — End: ?

## 2024-05-21 NOTE — Progress Notes (Addendum)
 ANNUAL EXAM Patient name: Sandra Mcgrath MRN [id number]  Date of birth: May 28, 1966 Chief Complaint:   Gynecologic Exam  History of Present Illness:   Sandra Mcgrath is a 58 y.o. G57P0021 Caucasian female being seen today for a routine annual exam.  Denies vaginal bleeding.  Needs updated pap today.  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  Having some hot flashes.  Using some vaginal estrogen cream intermittently.  Family history of breast cancer with her sister.  Discussed contrast enhanced mammograms for next breast cancer screening.  She will do some research.  Order not placed at this this time but can be.    Had abdominal wall hernia repaired since I saw her last.  Lipomas removed as well.  Mederma for scar healing or scar patches discussed.    Mother is 33 and has hx of Alzheimer's so this causes some stressors.    Patient's last menstrual period was 08/11/2019.  Last pap 12/21/2022. Results were: ASCUS w/ HRHPV positive: type not specified. H/O abnormal pap: yes Last mammogram: 01/22/2024. Results were: normal. Family h/o breast cancer: yes sister. Last colonoscopy: Patient did Cologuard screening on 09/19/2021. Results were: normal. Family h/o colorectal cancer: no     05/21/2024    9:48 AM 04/24/2023    2:45 PM 12/21/2022    3:25 PM 01/18/2022    4:19 PM 10/13/2021    2:18 PM  Depression screen PHQ 2/9  Decreased Interest 0 0 0 0 0  Down, Depressed, Hopeless 0 0 0 0 0  PHQ - 2 Score 0 0 0 0 0    Review of Systems:   Pertinent items are noted in HPI Denies any urinary or bowel changes.  Denies pelvic pain.   Pertinent History Reviewed:  Reviewed past medical,surgical, social and family history.  Reviewed problem list, medications and allergies. Physical Assessment:   Vitals:   05/21/24 0943  BP: 102/64  Pulse: 77  Weight: 140 lb 3.2 oz (63.6 kg)  Body mass index is 22.98 kg/m.        Physical Examination:    General appearance - well appearing, and in no distress  Mental status - alert, oriented to person, place, and time  Psych:  She has a normal mood and affect  Skin - warm and dry, normal color, no suspicious lesions noted  Chest - effort normal, all lung fields clear to auscultation bilaterally  Heart - normal rate and regular rhythm  Neck:  midline trachea, no thyromegaly or nodules  Breasts - breasts appear normal, no suspicious masses, no skin or nipple changes or  axillary nodes  Abdomen - soft, nontender, nondistended, no masses or organomegaly, several small scars from more recent surgery noted  Pelvic - VULVA: normal appearing vulva with no masses, tenderness or lesions   VAGINA: normal appearing vagina with normal color and discharge, no lesions   CERVIX: normal appearing cervix without discharge or lesions, no CMT  Thin prep pap is  HR HPV cotesting  UTERUS: uterus is felt to be normal size, shape, consistency and nontender   ADNEXA: No adnexal masses or tenderness noted.  Rectal - normal rectal, good sphincter tone, no masses felt  Extremities:  No swelling or varicosities noted  Chaperone not present per pt request  No results found for this or any previous visit (from the past 24 hours).  Assessment & Plan:  1. Well woman exam with routine gynecological exam (Primary) -  Pap smear with HR HPV obtained today - Mammogram 12/2023 - Cologuard 2023.  Will do next year.   - Bone mineral density guidelines reviewed - lab work is done with PCP - vaccines reviewed/updated  2. Family history of breast cancer - sister is doing very well - contrast enhanced mammogram discussed  3. H/O LEEP - Cytology - PAP( [place])  4. Dysplasia of cervix, high grade CIN 2   No orders of the defined types were placed in this encounter.   Meds:  Meds ordered this encounter  Medications   diazepam  (VALIUM ) 10 MG tablet    Sig: Take 1/2 tab prior to airplane flight.    Dispense:  20  tablet    Refill:  0    Follow-up: follow up depending on pap results  Ronal GORMAN Pinal, MD 05/25/2024 7:13 AM

## 2024-05-26 LAB — CYTOLOGY - PAP
Adequacy: ABSENT
Comment: NEGATIVE
Diagnosis: NEGATIVE
High risk HPV: NEGATIVE

## 2024-05-28 ENCOUNTER — Ambulatory Visit (HOSPITAL_BASED_OUTPATIENT_CLINIC_OR_DEPARTMENT_OTHER): Payer: Self-pay | Admitting: Obstetrics & Gynecology

## 2024-06-18 ENCOUNTER — Encounter (HOSPITAL_BASED_OUTPATIENT_CLINIC_OR_DEPARTMENT_OTHER): Payer: Self-pay | Admitting: Obstetrics & Gynecology

## 2024-06-20 ENCOUNTER — Other Ambulatory Visit (HOSPITAL_BASED_OUTPATIENT_CLINIC_OR_DEPARTMENT_OTHER): Payer: Self-pay

## 2024-06-20 MED ORDER — ESTRADIOL 0.025 MG/24HR TD PTWK: 0.0250 mg | MEDICATED_PATCH | TRANSDERMAL | 3 refills | Status: AC

## 2024-11-24 ENCOUNTER — Ambulatory Visit (HOSPITAL_BASED_OUTPATIENT_CLINIC_OR_DEPARTMENT_OTHER): Admitting: Obstetrics & Gynecology
